# Patient Record
Sex: Female | Born: 1960 | Race: White | Hispanic: No | Marital: Married | Smoking: Never smoker
Health system: Southern US, Community
[De-identification: ages and names within clinical notes are randomized; demographics above are authoritative.]

## PROBLEM LIST (undated history)

## (undated) DIAGNOSIS — E079 Disorder of thyroid, unspecified: Secondary | ICD-10-CM

## (undated) DIAGNOSIS — I77810 Thoracic aortic ectasia: Secondary | ICD-10-CM

## (undated) DIAGNOSIS — M6281 Muscle weakness (generalized): Secondary | ICD-10-CM

## (undated) DIAGNOSIS — K219 Gastro-esophageal reflux disease without esophagitis: Secondary | ICD-10-CM

## (undated) DIAGNOSIS — J45909 Unspecified asthma, uncomplicated: Secondary | ICD-10-CM

## (undated) DIAGNOSIS — M79606 Pain in leg, unspecified: Secondary | ICD-10-CM

## (undated) DIAGNOSIS — G479 Sleep disorder, unspecified: Secondary | ICD-10-CM

## (undated) DIAGNOSIS — I2510 Atherosclerotic heart disease of native coronary artery without angina pectoris: Secondary | ICD-10-CM

## (undated) DIAGNOSIS — K222 Esophageal obstruction: Secondary | ICD-10-CM

## (undated) DIAGNOSIS — E785 Hyperlipidemia, unspecified: Secondary | ICD-10-CM

## (undated) DIAGNOSIS — I700 Atherosclerosis of aorta: Secondary | ICD-10-CM

## (undated) HISTORY — DX: Gastro-esophageal reflux disease without esophagitis: K21.9

## (undated) HISTORY — DX: Hyperlipidemia, unspecified: E78.5

## (undated) HISTORY — PX: TONSILLECTOMY: SUR1361

## (undated) HISTORY — DX: Muscle weakness (generalized): M62.81

## (undated) HISTORY — PX: UPPER GI ENDOSCOPY: SHX6162

## (undated) HISTORY — DX: Thoracic aortic ectasia: I77.810

## (undated) HISTORY — PX: DILATION AND CURETTAGE OF UTERUS: SHX78

## (undated) HISTORY — DX: Esophageal obstruction: K22.2

## (undated) HISTORY — DX: Atherosclerosis of aorta: I70.0

## (undated) HISTORY — DX: Unspecified asthma, uncomplicated: J45.909

## (undated) HISTORY — DX: Atherosclerotic heart disease of native coronary artery without angina pectoris: I25.10

## (undated) HISTORY — DX: Sleep disorder, unspecified: G47.9

## (undated) HISTORY — DX: Disorder of thyroid, unspecified: E07.9

## (undated) HISTORY — DX: Pain in leg, unspecified: M79.606

---

## 1997-01-18 HISTORY — PX: DILATION AND CURETTAGE OF UTERUS: SHX78

## 1997-01-18 HISTORY — PX: DIAGNOSTIC LAPAROSCOPY: SUR761

## 2005-05-04 ENCOUNTER — Ambulatory Visit: Payer: Self-pay

## 2005-11-12 HISTORY — PX: URETHRAL DILATION: SUR417

## 2009-04-19 ENCOUNTER — Ambulatory Visit: Payer: Self-pay | Admitting: Internal Medicine

## 2009-12-28 ENCOUNTER — Ambulatory Visit: Payer: Self-pay | Admitting: Unknown Physician Specialty

## 2010-05-02 IMAGING — RF DG UGI W/O KUB
1 series · 14 of 24 positions shown · non-contrast
Comparison: No comparison

REASON FOR EXAM: acid reflux
COMMENTS:

PROCEDURE:     FL  - FL UPPER GI  - April 19, 2009  [DATE]
RESULT:     Indication: Acid reflux

[Series 1: run · 25 acquisitions, 14 frames shown]
[im 1/25]
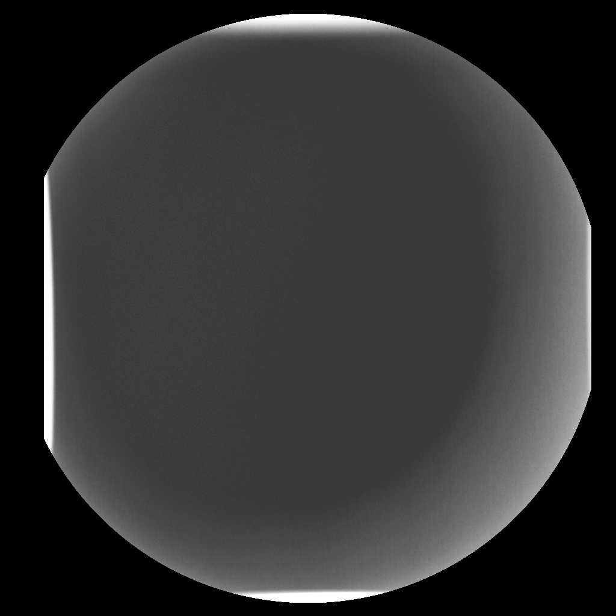
[im 2/25]
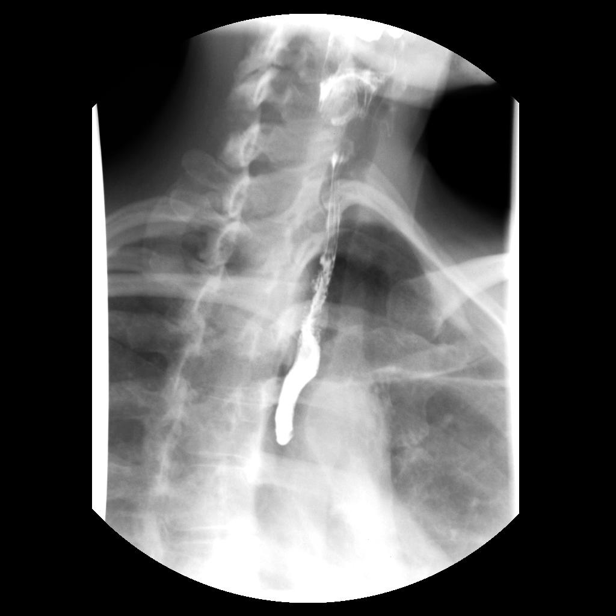
[im 3/25]
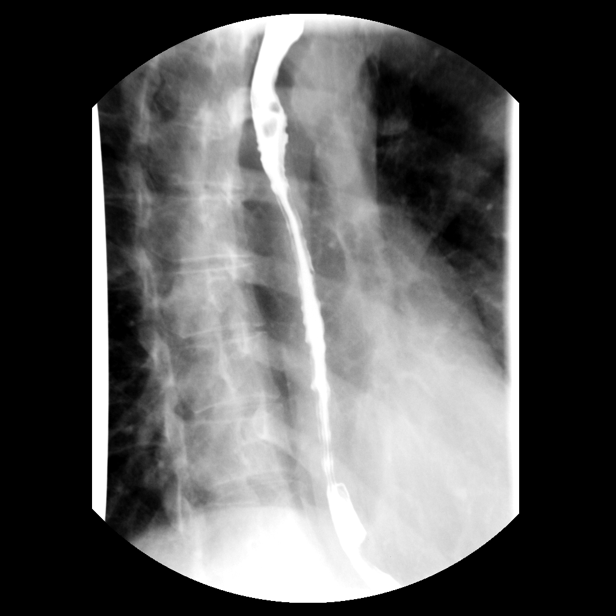
[im 4/25]
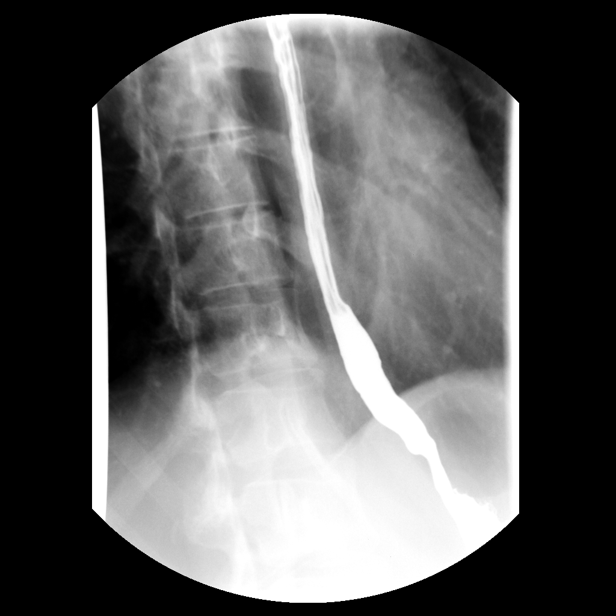
[im 5/25]
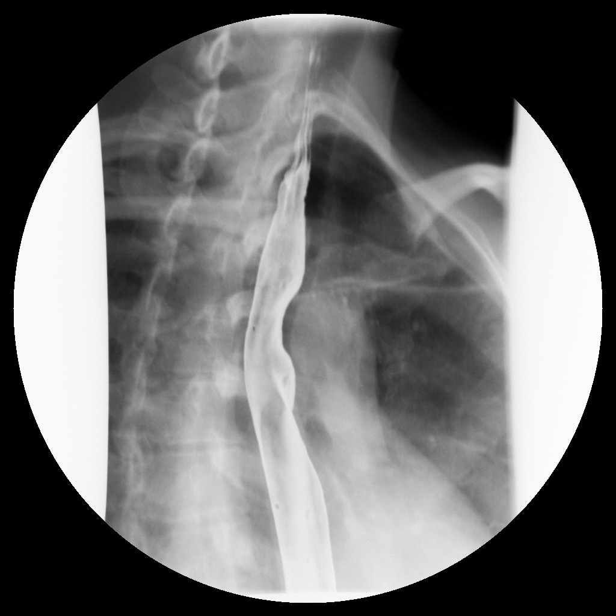
[im 5/25]
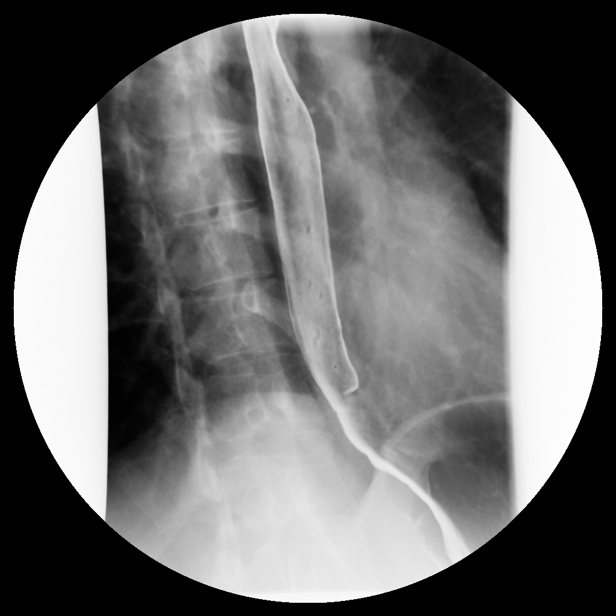
[im 6/25]
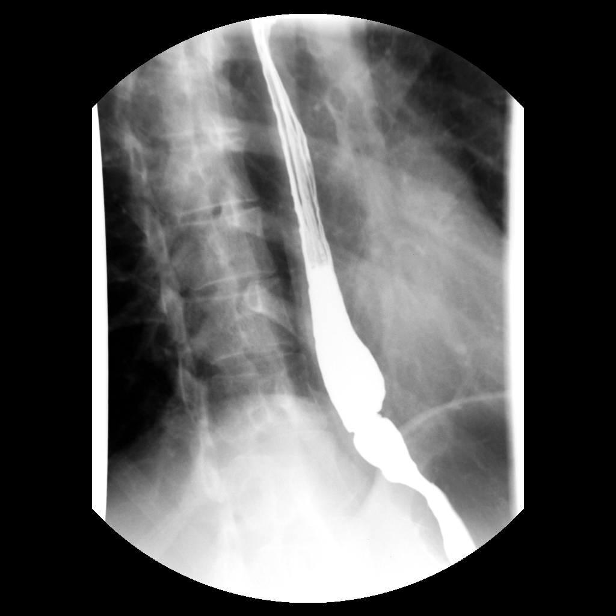
[im 7/25]
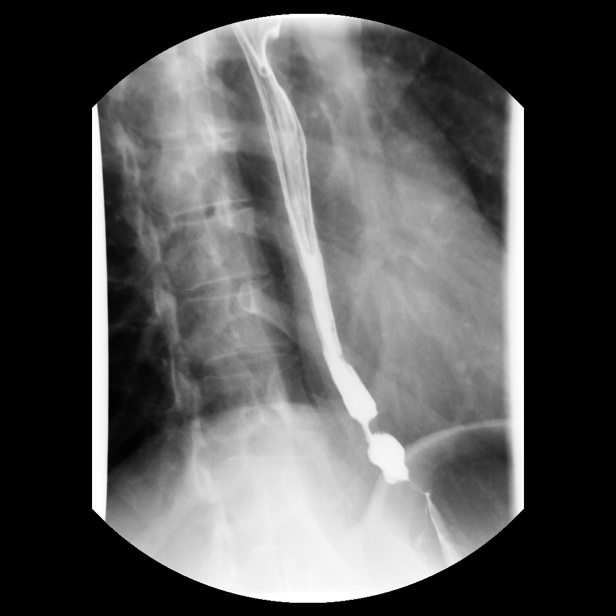
[im 10/25]
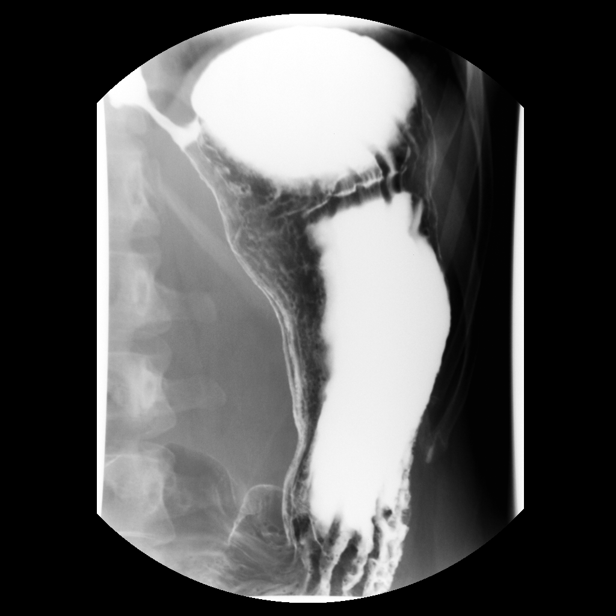
[im 13/25]
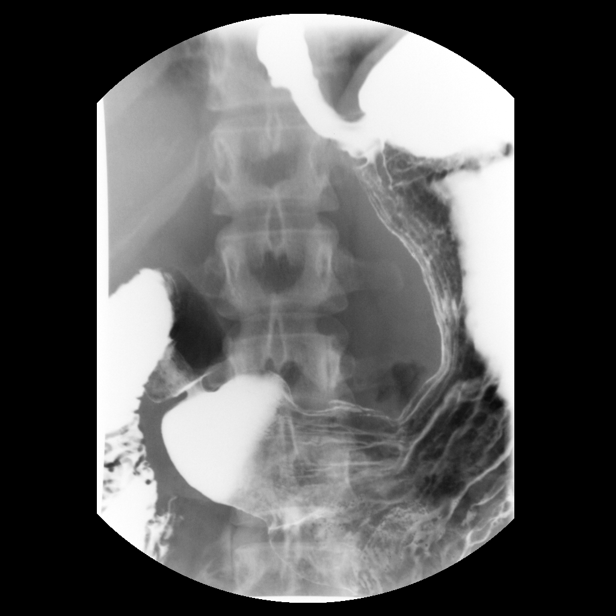
[im 17/25]
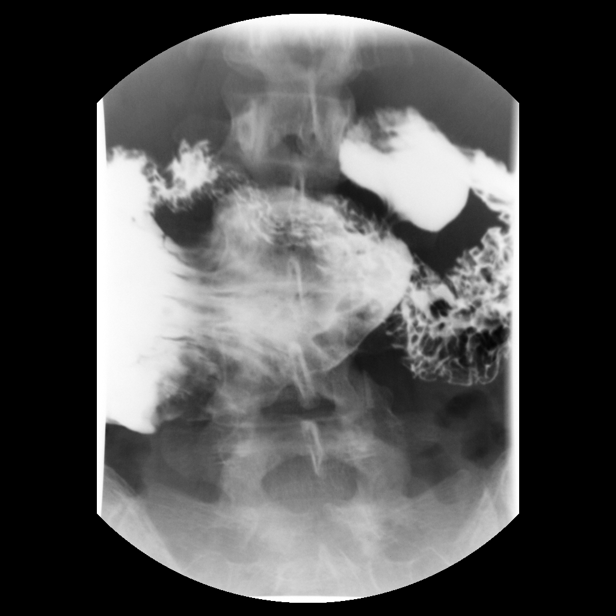
[im 18/25]
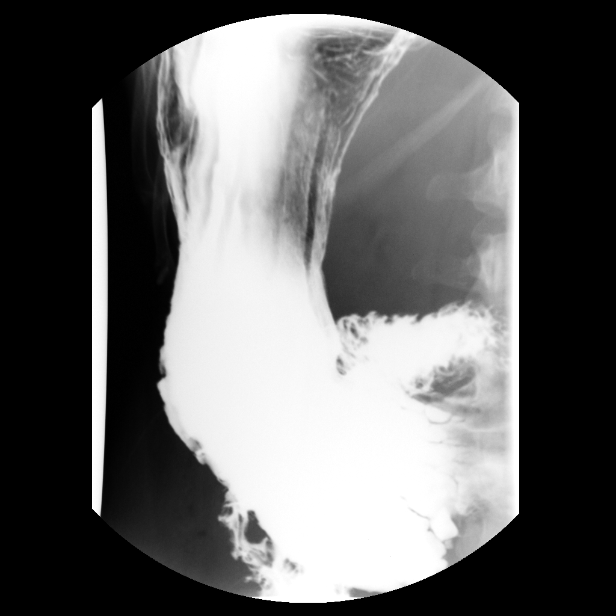
[im 21/25]
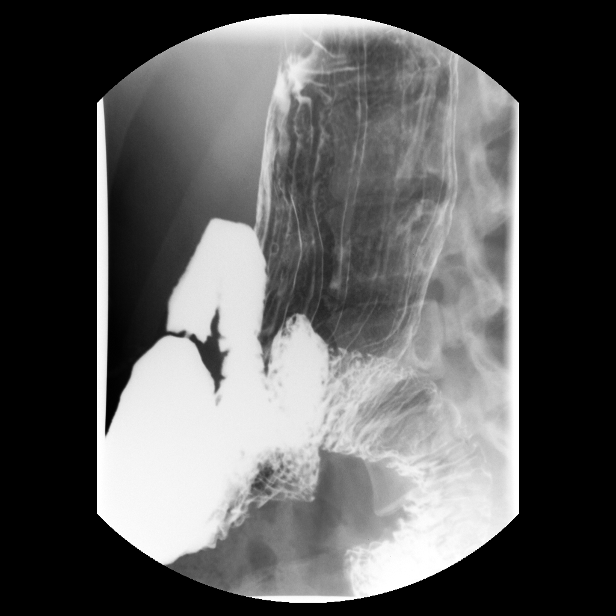
[im 25/25]
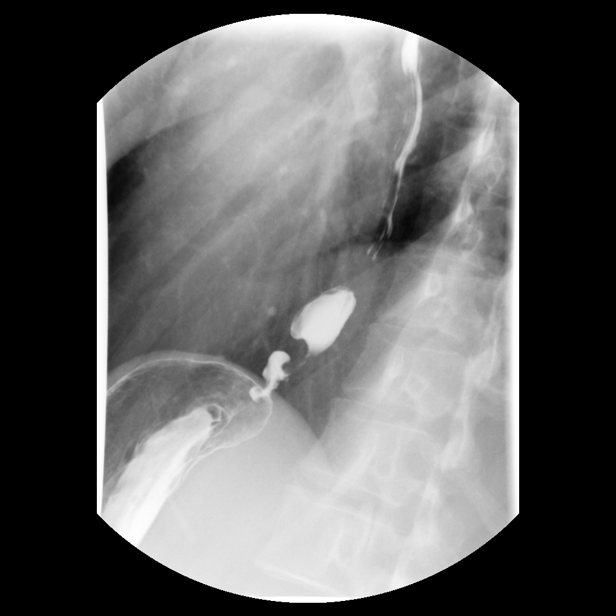

[14 of 24 positions shown; findings below may reference images not displayed]

FINDINGS: Biphasic examination of the esophagus to the distal duodenum was performed
without complication. Total fluoroscopy time was 2.1 minutes.

Examination of the esophagus demonstrated normal esophageal motility. Normal
esophageal morphology without evidence of esophagitis or ulceration. There
is a small distal esophageal web just proximal to the gastroesophageal
junction without obstruction of flow. No evidence of hiatal hernia. There is
no spontaneous or inducible gastroesophageal reflux.

Examination of the stomach demonstrated normal rugal folds and areae
gastricae. The gastric mucosa appeared unremarkable without evidence of
ulceration, scarring, or mass lesion. Gastric motility and emptying was
normal. Fluoroscopic examination of the duodenum demonstrates normal
motility and morphology without evidence of ulceration or mass lesion.

The patient did not feel that she could swallow the barium tablet for
further assessment of the distal esophageal web.
IMPRESSION: 1. Mild distal esophageal web just proximal to the gastroesophageal
junction. The patient did not feel that she could swallow the barium tablet
for further assessment of the distal esophageal web.

## 2012-10-31 LAB — HEPATIC FUNCTION PANEL
ALT: 12 U/L (ref 7–35)
AST: 16 U/L (ref 13–35)
Alkaline Phosphatase: 64 U/L (ref 25–125)
Bilirubin, Total: 0.3 mg/dL

## 2012-10-31 LAB — CBC AND DIFFERENTIAL
HEMATOCRIT: 38 % (ref 36–46)
Hemoglobin: 12.6 g/dL (ref 12.0–16.0)
NEUTROS ABS: 3 /uL
Platelets: 343 10*3/uL (ref 150–399)
WBC: 5.3 10^3/mL

## 2012-10-31 LAB — LIPID PANEL
CHOLESTEROL: 197 mg/dL (ref 0–200)
HDL: 98 mg/dL — AB (ref 35–70)
LDL Cholesterol: 91 mg/dL
LDL/HDL RATIO: 0.9
Triglycerides: 40 mg/dL (ref 40–160)

## 2012-10-31 LAB — BASIC METABOLIC PANEL
BUN: 9 mg/dL (ref 4–21)
Creatinine: 0.7 mg/dL (ref 0.5–1.1)
Glucose: 89 mg/dL
Potassium: 4.1 mmol/L (ref 3.4–5.3)
SODIUM: 138 mmol/L (ref 137–147)

## 2013-01-05 ENCOUNTER — Inpatient Hospital Stay: Payer: Self-pay | Admitting: Internal Medicine

## 2013-01-05 LAB — BASIC METABOLIC PANEL
BUN: 11 mg/dL (ref 7–18)
Calcium, Total: 9.1 mg/dL (ref 8.5–10.1)
Chloride: 106 mmol/L (ref 98–107)
Co2: 29 mmol/L (ref 21–32)
Creatinine: 0.68 mg/dL (ref 0.60–1.30)
Glucose: 111 mg/dL — ABNORMAL HIGH (ref 65–99)
Osmolality: 278 (ref 275–301)
Sodium: 139 mmol/L (ref 136–145)

## 2013-01-05 LAB — CK TOTAL AND CKMB (NOT AT ARMC): CK, Total: 78 U/L (ref 21–215)

## 2013-01-05 LAB — CBC
HCT: 39.8 % (ref 35.0–47.0)
HGB: 12.9 g/dL (ref 12.0–16.0)
MCH: 26.1 pg (ref 26.0–34.0)
RBC: 4.93 10*6/uL (ref 3.80–5.20)
RDW: 14.1 % (ref 11.5–14.5)
WBC: 6.1 10*3/uL (ref 3.6–11.0)

## 2013-01-05 LAB — TROPONIN I: Troponin-I: <0.02 ng/mL

## 2014-01-18 IMAGING — CR DG CHEST 2V
1 series · 2 of 2 positions shown · non-contrast
Comparison: none

REASON FOR EXAM: SOB
COMMENTS:

PROCEDURE:     DXR - DXR CHEST PA (OR AP) AND LATERAL  - January 05, 2013  [DATE]
RESULT:     Lungs clear. Heart size normal.

[Series 1: w chest pa · 0.14mm/px · 2 of 2 slices shown]
[im 1/2]
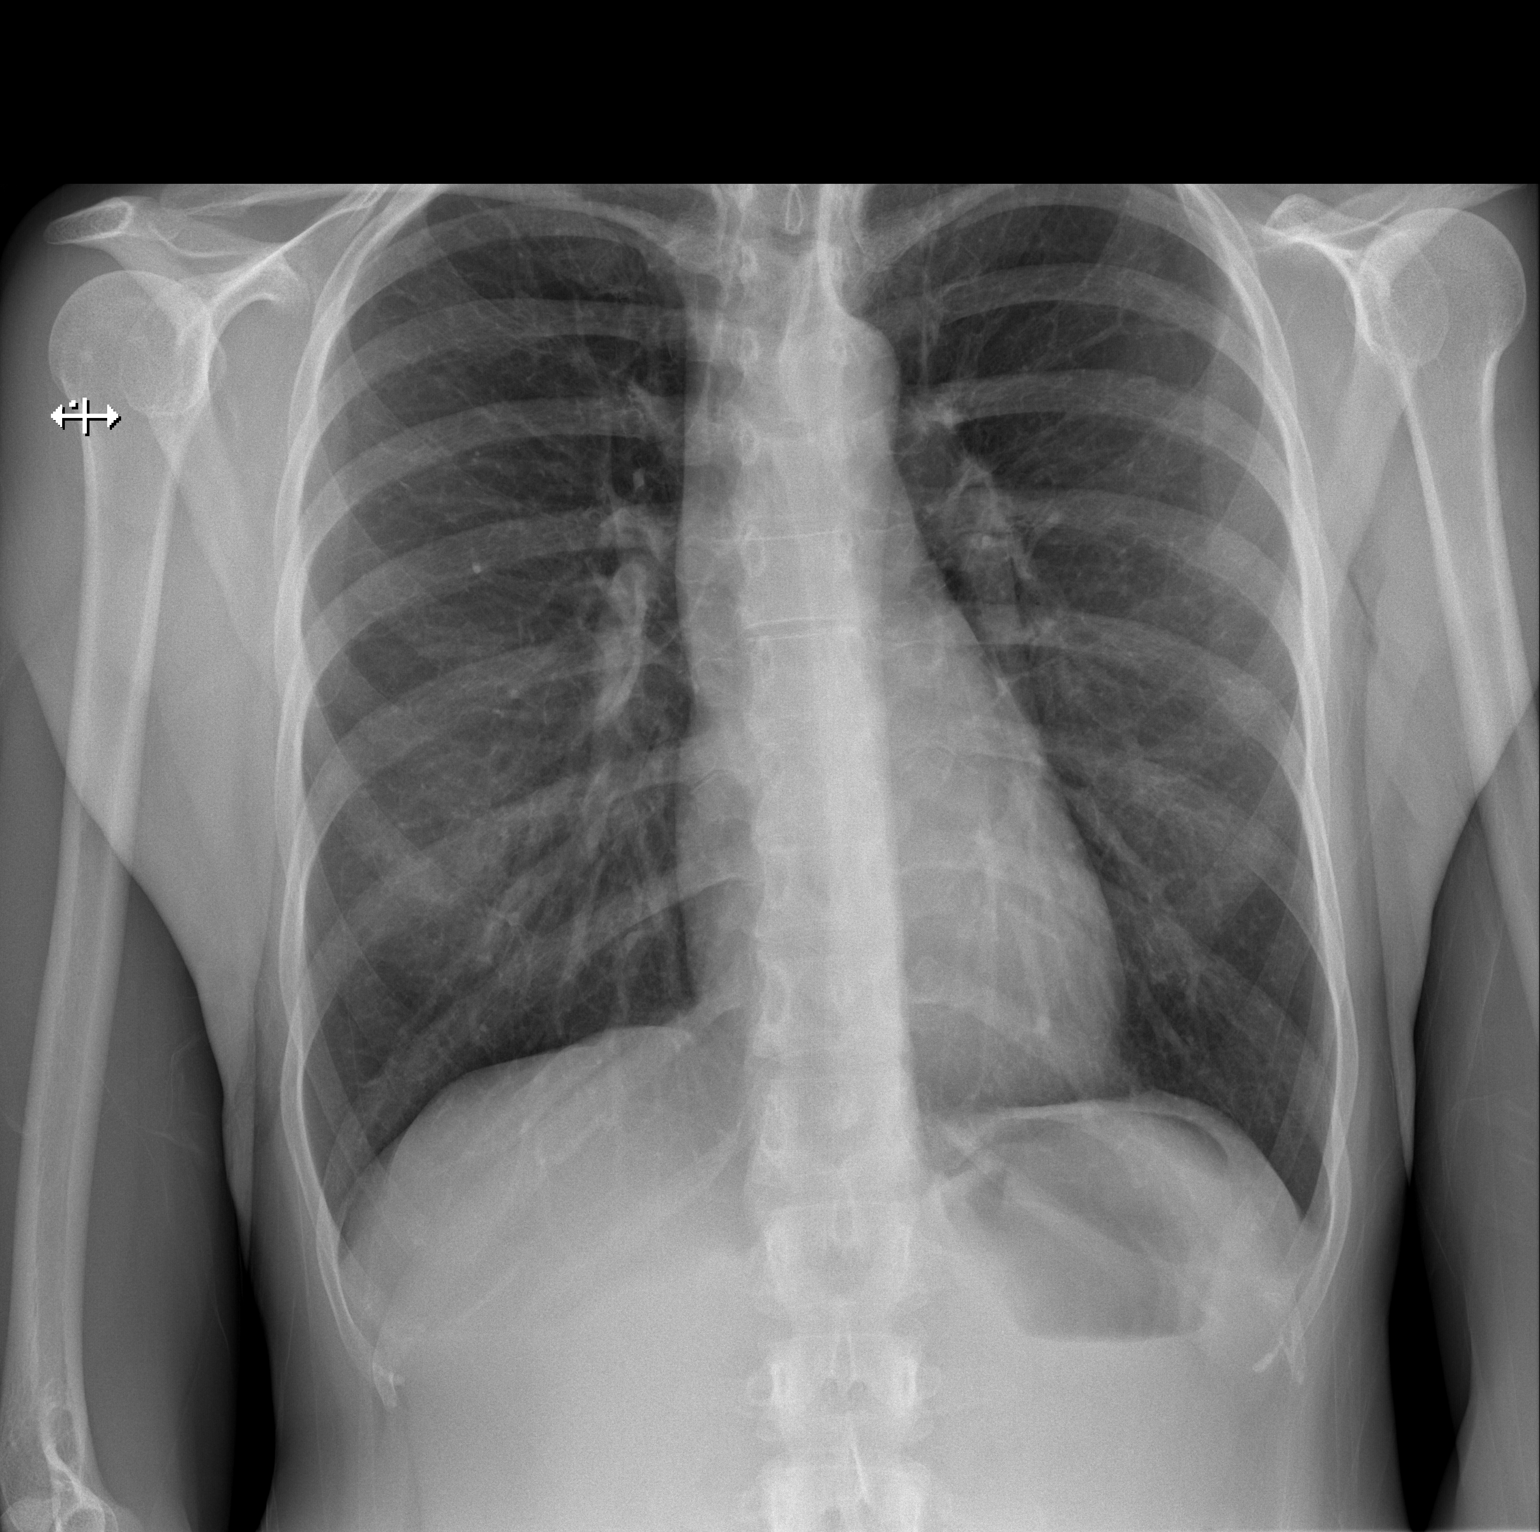
[im 2/2]
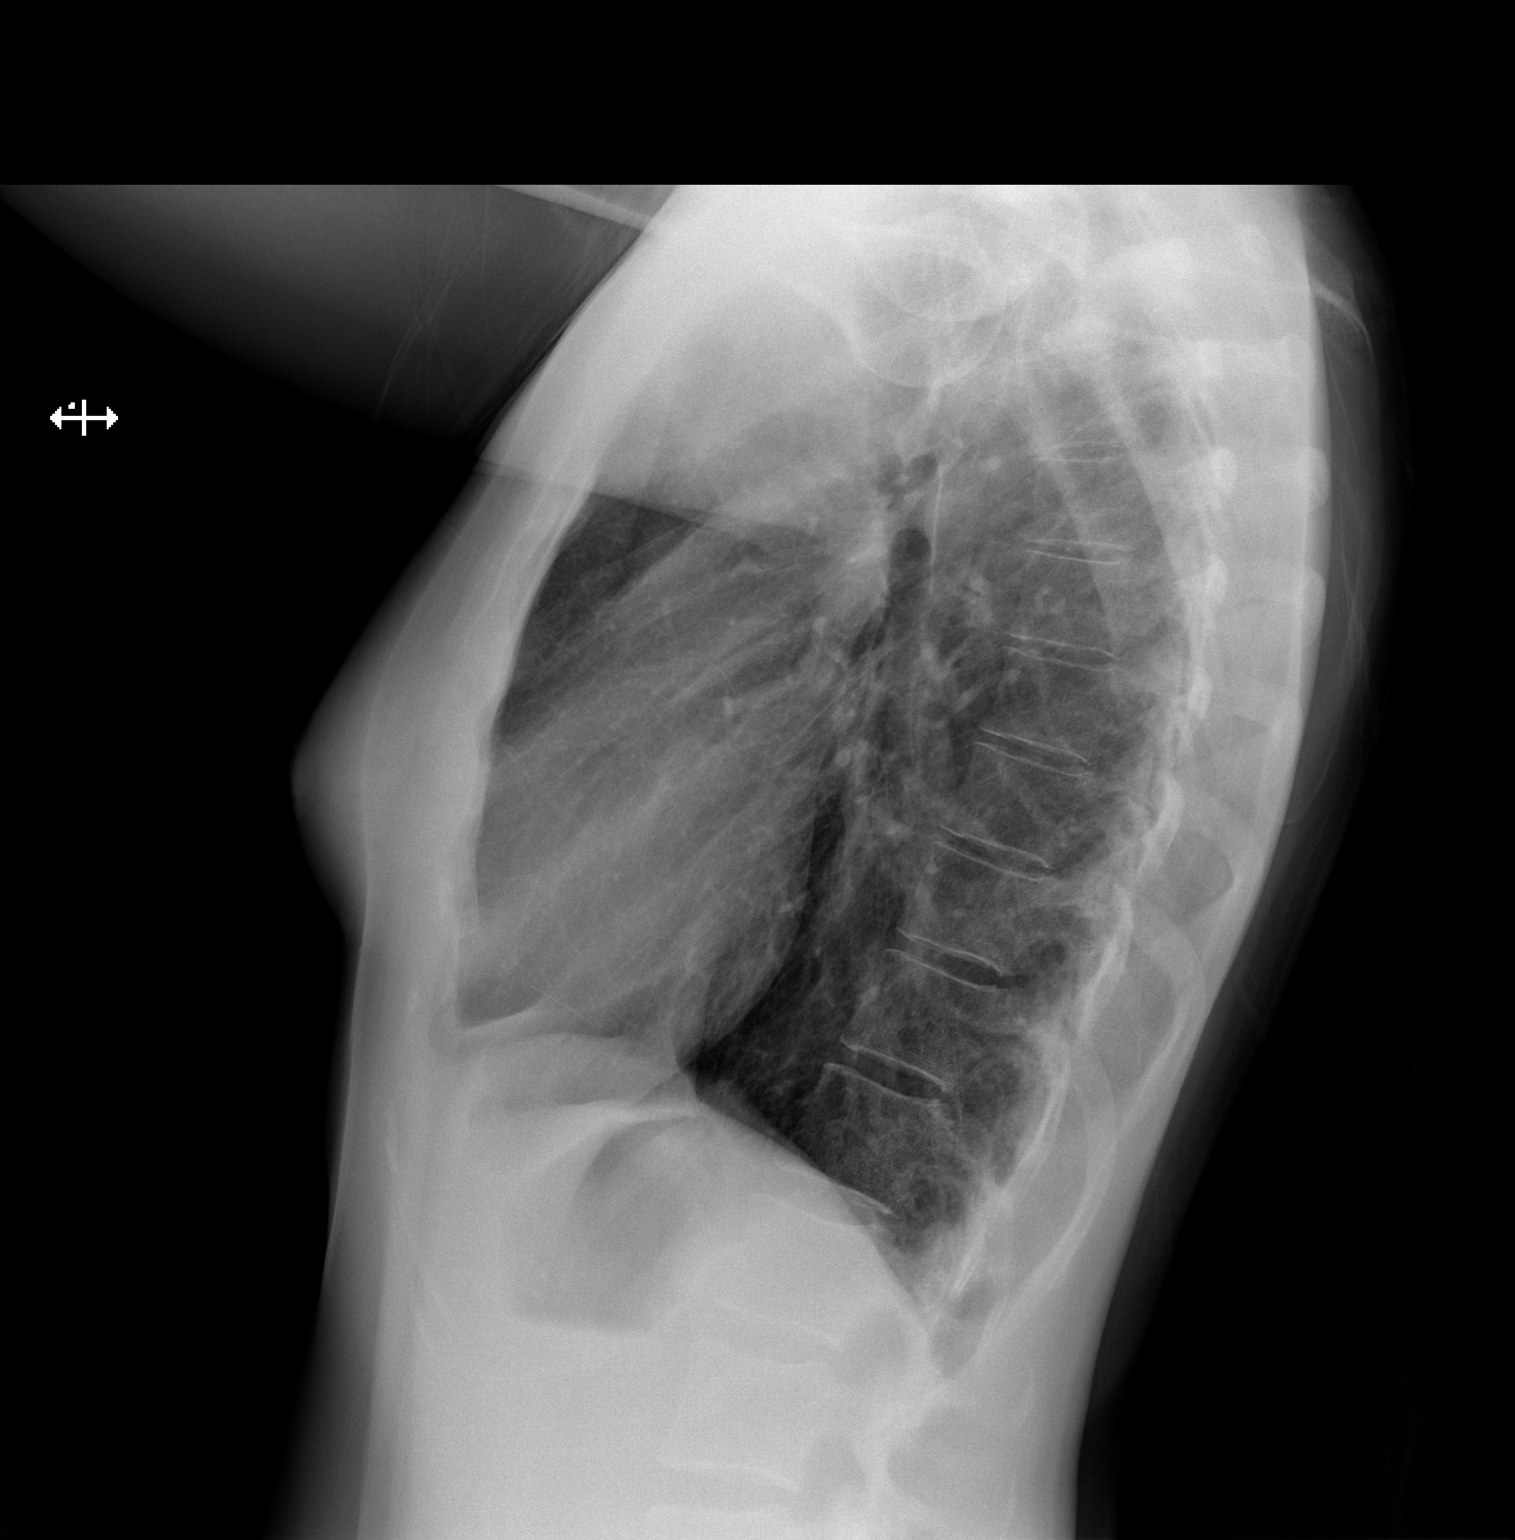

[2 of 2 positions shown; findings below may reference images not displayed]

IMPRESSION: No acute abnormality.

## 2014-01-18 IMAGING — CT CT CHEST W/ CM
1 series · 16 of 33 positions shown, 20 images · non-contrast
Comparison: none

REASON FOR EXAM: dyspnea on excertion
COMMENTS:

PROCEDURE:     CT  - CT CHEST (FOR PE) W  - January 05, 2013  [DATE]
RESULT:     History: Chest tightness.
Comparison Study: Chest x-ray of 01/05/2013.

[Series 4: soft tissue · axial · 0.62mm/px · z∈[-665,-335]mm · 16 of 120 slices shown, 20 images]
[im 5/120  mediastinal]
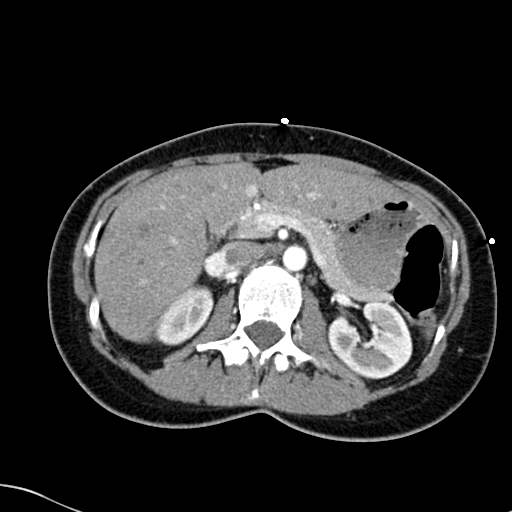
[im 5/120  lung]
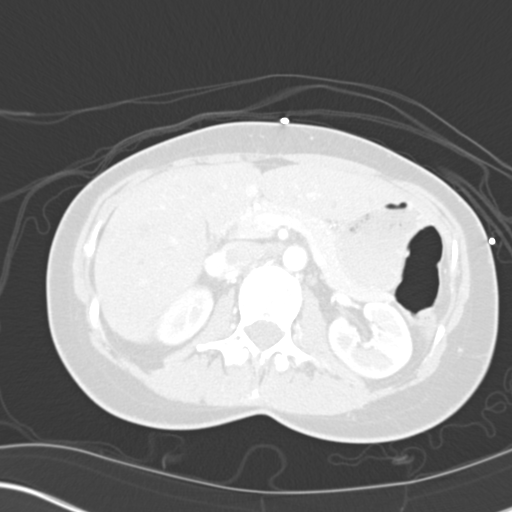
[im 14/120  lung]
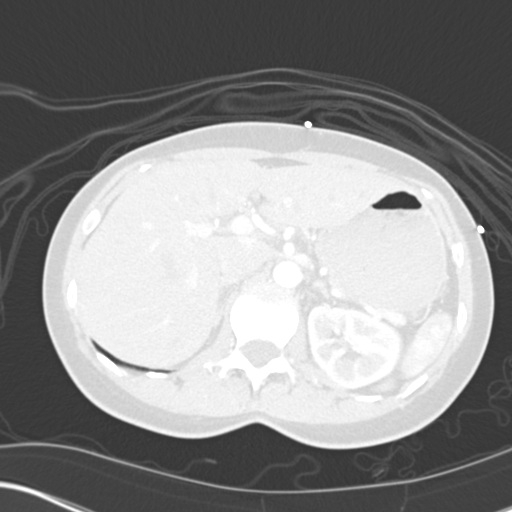
[im 23/120  lung]
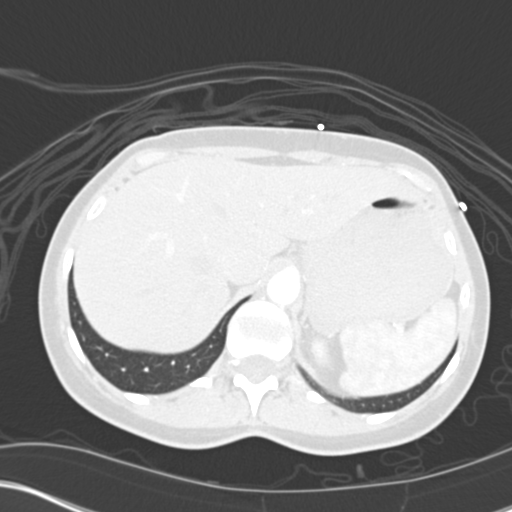
[im 27/120  lung]
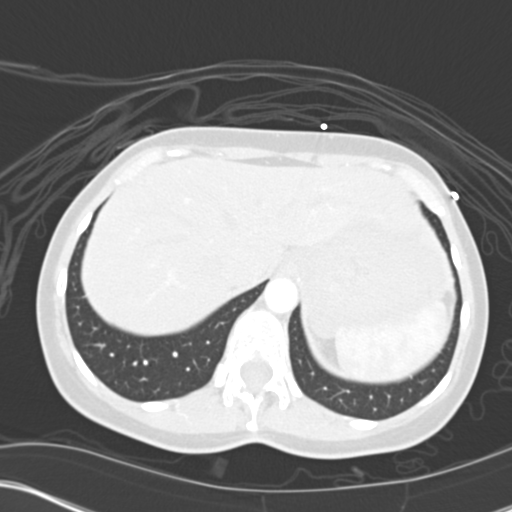
[im 36/120  mediastinal]
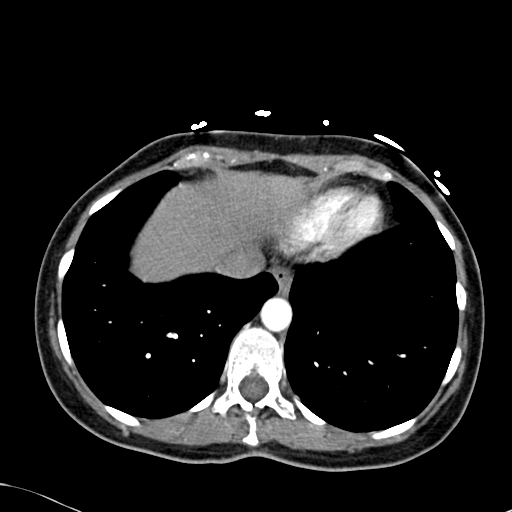
[im 36/120  lung]
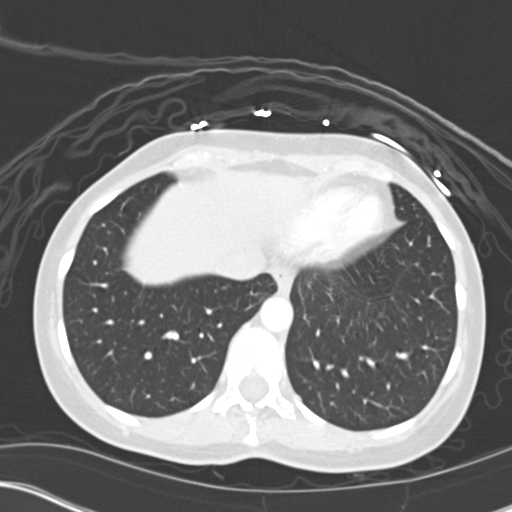
[im 45/120  lung]
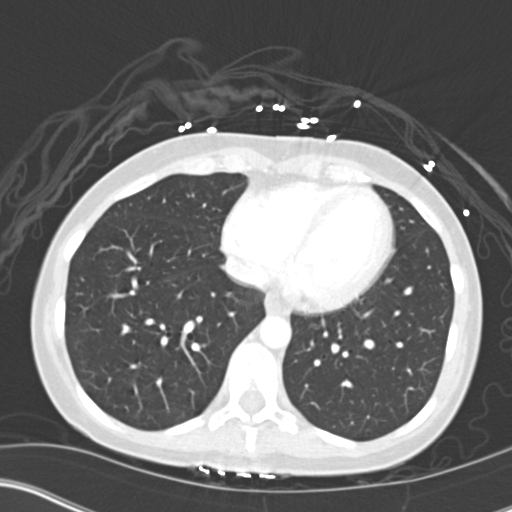
[im 49/120  lung]
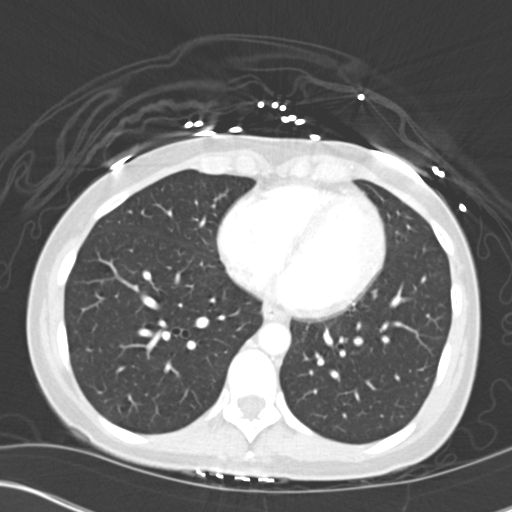
[im 58/120  lung]
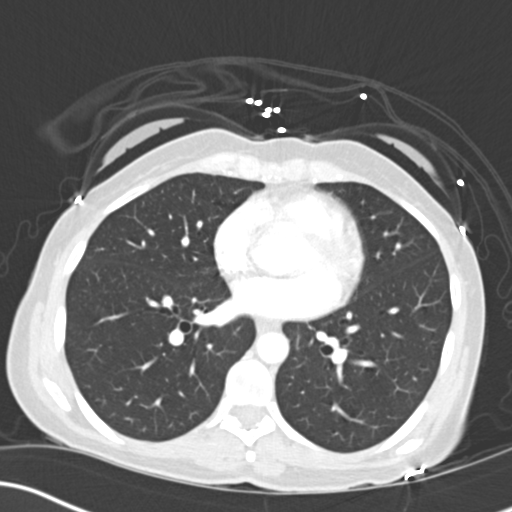
[im 63/120  mediastinal]
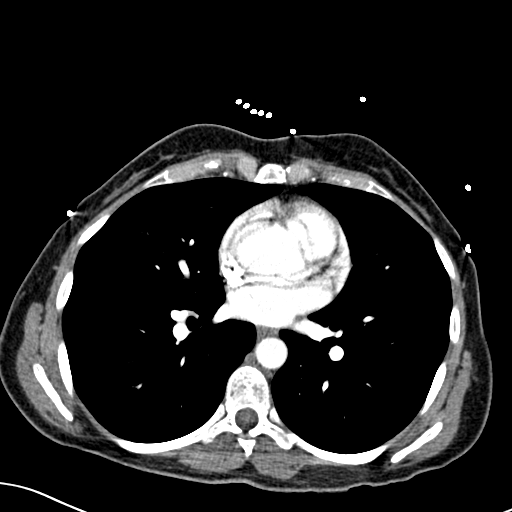
[im 63/120  lung]
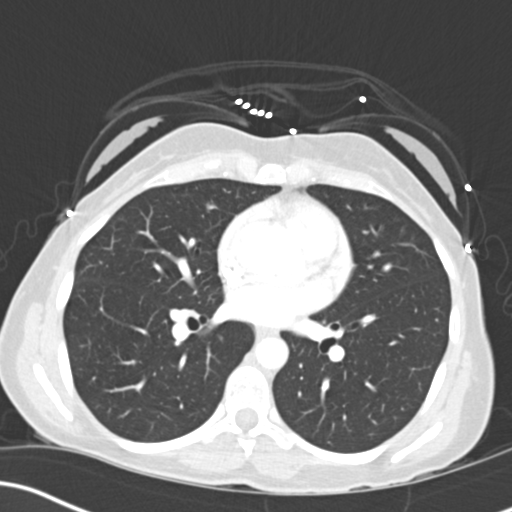
[im 71/120  lung]
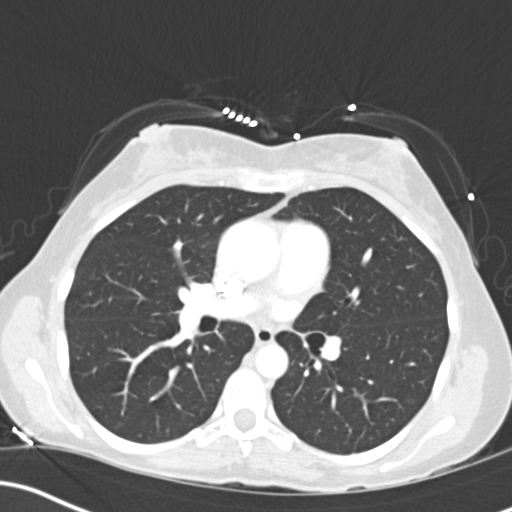
[im 75/120  lung]
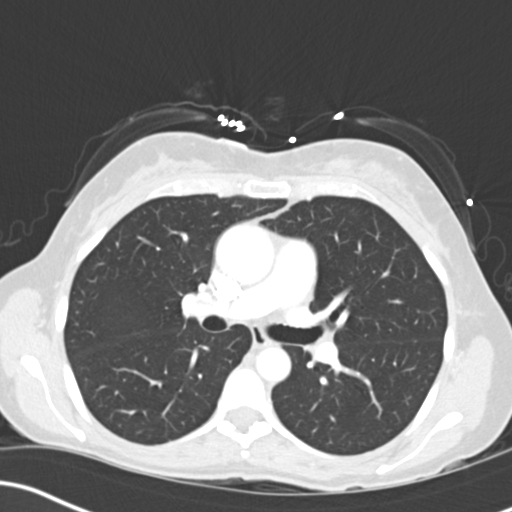
[im 84/120  lung]
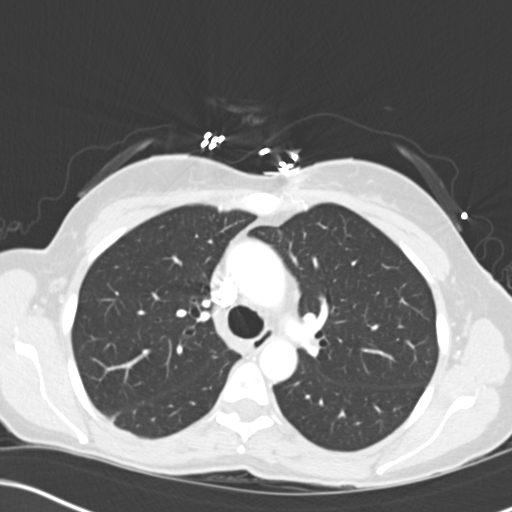
[im 93/120  mediastinal]
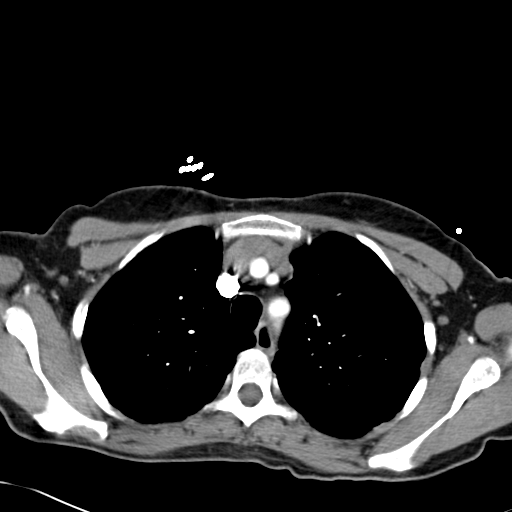
[im 93/120  lung]
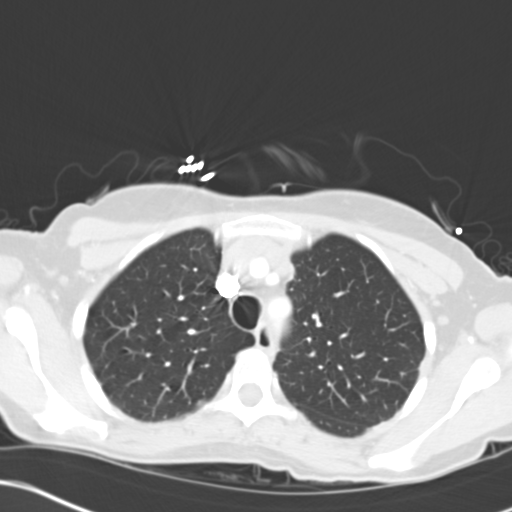
[im 97/120  lung]
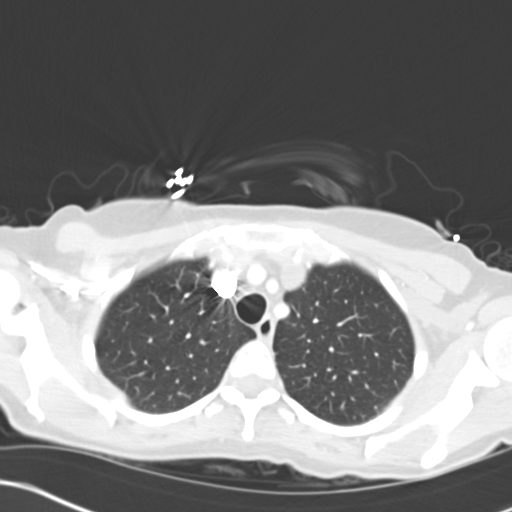
[im 106/120  lung]
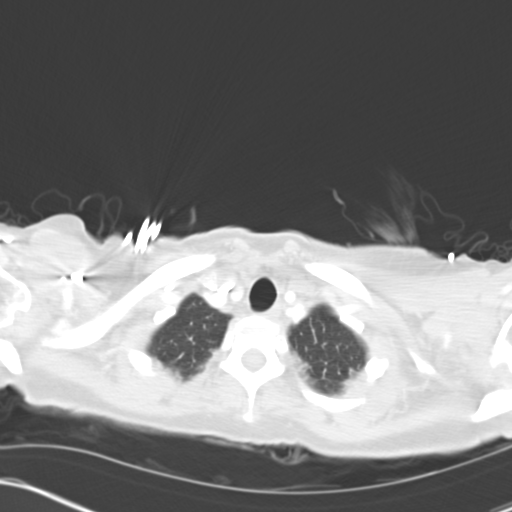
[im 115/120  lung]
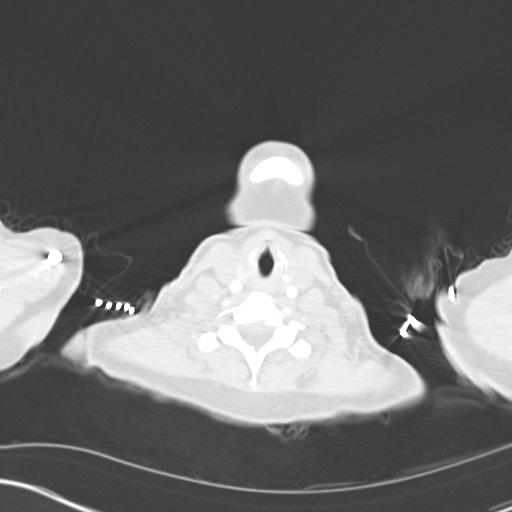

[16 of 33 positions shown; findings below may reference images not displayed]

FINDINGS: Standard CT obtained under cc of [id number]. Aortic dissection of
the ascending aorta  cannot be excluded. Although motion artifact could
present in this fashion again a dissection cannot be excluded. Involvement
of the coronary arteries cannot be excluded. Maximum diameter of the
ascending aorta  4.5 cm .Descending thoracic aorta and upper abdominal aorta
normal. Great vessels are normal. Pulmonary arteries are normal. Heart size
normal. Adrenals normal. Kidneys unremarkable. Large airways patent. Lungs
are clear.
IMPRESSION: Possible aortic dissection involving the ascending thoracic
aorta. Cardio thoracic surgery consultation suggested. Report phoned to
patient's physician at time of procedure.

## 2015-03-04 NOTE — H&P (Signed)
Subjective/Chief Complaint dyspnea on excertion, chest tightness 1 month, worsening since yesterday.   History of Present Illness 54 yo female, asthma, hypothyroidism, esophageal stricture, GERD  dyspnea on excertion, chest tightness 1 month, worsening since yesterday. Pt has had dyspnea on and off for 2 years. But has been worsening, recently, with dizziness, chest tightness, weakness. Pt HR up to 140 while walking in ED. denies any cough, sputum, orthopnea, but has leg edema sometimes. Pt denies any long distance travel recently.   Past History asthma, hypothyroidism, esophageal stricture, GERD   Primary Physician Dr. Rosanna Randy   Past Med/Surgical Hx:  GERD:   Esophageal Strictures with dilitations:   Hypothyroidism:   Asthma:   ALLERGIES:  No Known Allergies:   HOME MEDICATIONS: Medication Instructions Status  Armour Thyroid 30 mg oral tablet milligram(s) orally every other day. Alternates between 30mg  and 45mg . Active  Nexium 40 mg oral delayed release capsule 1 cap(s) orally once a day Active  amitriptyline 100 mg oral tablet 1 tab(s) orally once a day (at bedtime) Active  progesterone 1.7% topical cream Apply topically to affected area once a day Active  testosterone 2% transdermal cream 1 application transdermal once a day Active  Symbicort 80 mcg-4.5 mcg/inh inhalation aerosol 2 puff(s) inhaled once a day, As Needed Active  lorazepam 0.5 mg oral tablet 1 tab(s) orally once a day (at bedtime), As Needed- for Anxiety, Nervousness  Active   Family and Social History:  Family History Hypertension  Diabetes Mellitus   Social History negative tobacco, negative ETOH, negative Illicit drugs   Review of Systems:  Fever/Chills No   Cough No   Sputum No   Abdominal Pain No   Diarrhea No   Constipation No   Nausea/Vomiting No   SOB/DOE Yes   Chest Pain No  tightness   Telemetry Reviewed NSR   Dysuria No   Tolerating PT No   Tolerating Diet No    Medications/Allergies Reviewed Medications/Allergies reviewed   Physical Exam:  GEN no acute distress, thin   HEENT pink conjunctivae, PERRL, moist oral mucosa   NECK supple  No masses  thyroid not tender   RESP normal resp effort  clear BS  no use of accessory muscles   CARD regular rate  no murmur  no thrills  no carotid bruits  No LE edema  no JVD   ABD denies tenderness  no liver/spleen enlargement  no hernia  soft  normal BS  no Adominal Mass   LYMPH negative neck   EXTR negative cyanosis/clubbing, negative edema   SKIN normal to palpation, No rashes, No ulcers, skin turgor good   NEURO cranial nerves intact, follows commands, motor/sensory function intact   PSYCH alert, A+O to time, place, person, good insight   Lab Results:  Routine Chem:  24-Feb-14 14:31   Glucose, Serum  111  BUN 11  Creatinine (comp) 0.68  Sodium, Serum 139  Potassium, Serum  3.4  Chloride, Serum 106  CO2, Serum 29  Calcium (Total), Serum 9.1  Anion Gap  4  Osmolality (calc) 278  eGFR (African American) >60  eGFR (Non-African American) >60 (eGFR values <48mL/min/1.73 m2 may be an indication of chronic kidney disease (CKD). Calculated eGFR is useful in patients with stable renal function. The eGFR calculation will not be reliable in acutely ill patients when serum creatinine is changing rapidly. It is not useful in  patients on dialysis. The eGFR calculation may not be applicable to patients at the low and high extremes  of body sizes, pregnant women, and vegetarians.)  Cardiac:  24-Feb-14 14:31   Troponin I < 0.02 (0.00-0.05 0.05 ng/mL or less: NEGATIVE  Repeat testing in 3-6 hrs  if clinically indicated. >0.05 ng/mL: POTENTIAL  MYOCARDIAL INJURY. Repeat  testing in 3-6 hrs if  clinically indicated. NOTE: An increase or decrease  of 30% or more on serial  testing suggests a  clinically important change)  CK, Total 78  CPK-MB, Serum  < 0.5 (Result(s) reported on 05 Jan 2013 at  02:55PM.)  Routine Coag:  24-Feb-14 19:49   D-Dimer, Quantitative  1.06 (If the D-dimer test is being used to assist in the exclusion of DVT and/or PE, note the following:  In various studies concerning the D-dimer methodology (STA Liatest) in use by this laboratory, it has been reported that with a cut-off value of 0.50 ug/mL FEU, the  negative predictive value regarding the exclusion of thrombosis is within the 95-100% range.  In patients with high pre-test probability of DVT/PE the results of the D-dimer test should be correlated with other diagnostic and clinical assessment modalities." Reference: [employer]., 2005.)  Routine Hem:  24-Feb-14 14:31   WBC (CBC) 6.1  RBC (CBC) 4.93  Hemoglobin (CBC) 12.9  Hematocrit (CBC) 39.8  Platelet Count (CBC) 310 (Result(s) reported on 05 Jan 2013 at 02:51PM.)  MCV 81  MCH 26.1  MCHC 32.4  RDW 14.1    Assessment/Admission Diagnosis dyspnea on excertion, Tachycardia elevated d-dimer,  r/o PE asthma Hypothyroidism GERD   Plan tele, O2,  CT angio, if PE, will give lovenox 78m/kg SQ q12h. f/u BNP, echo. cont symbicort, xopenex prn. lipid panel protonix  FULL CODE   Electronic Signatures: Demetrios Loll (MD)  (Signed 24-Feb-14 21:02)  Authored: CHIEF COMPLAINT and HISTORY, PAST MEDICAL/SURGIAL HISTORY, ALLERGIES, HOME MEDICATIONS, FAMILY AND SOCIAL HISTORY, REVIEW OF SYSTEMS, PHYSICAL EXAM, LABS, ASSESSMENT AND PLAN   Last Updated: 24-Feb-14 21:02 by Demetrios Loll (MD)

## 2015-03-04 NOTE — H&P (Signed)
PATIENT NAME:  Lisa KirschnerWARNER, Tuwana W MR#:  [id number] DATE OF BIRTH:  1961-10-31  DATE OF ADMISSION:  01/05/2013  PRIMARY CARE PHYSICIAN:  Dr. Sullivan LoneGilbert.  REFERRING PHYSICIAN: Dr. Shaune PollackLord.   CHIEF COMPLAINT: Dyspnea on exertion, with chest tightness for one month, worsening since yesterday.   HPI: A 54 year old Caucasian female with a history of asthma, hypothyroidism, esophageal stricture, GERD, presented to the ED with dyspnea on exertion and chest heaviness for one month, worsening since yesterday. The patient is alert, awake, oriented, in no acute distress. The patient said she has had dyspnea on and off for the past two years, but that has been worsening recently for the past one month. She also complains of dizziness, chest tightness, and generalized weakness. In addition, she feels some back pain sometimes, but she denies any fever or chills. No cough, sputum, or orthopnea, or nocturnal dyspnea, but has some leg edema sometimes. She denies any long distance travel recently.   PAST MEDICAL HISTORY: Asthma, hypothyroidism, esophageal stricture, and GERD.    SOCIAL HISTORY: No smoking, alcohol drinking. No illicit drugs.    SURGICAL HISTORY: No.   FAMILY HISTORY: Mother had a stroke and hypertension. Father has diabetes.   ALLERGIES: No.  MEDICATIONS:  1. Armour Thyroid 30 mg p.o. every other day, alternate between 30 mg and  45 mg.  2. Nexium 40 mg p.o. daily.  3. Amitriptyline 100 mg p.o. daily at bedtime.  4. Progesterone 1.7% topical cream, apply topically to affected area once a day.  5. Testosterone 2% transdermal cream, one application transdermal once a day.  6. Symbicort 18 mcg/4.5 mcg inhalation, aerosol, two puffs once a day, p.r.n.  7. Ativan 0.5 mg p.o. one tablet once a day at bedtime p.r.n., for anxiety and nervousness.   REVIEW OF SYSTEMS:   CONSTITUTIONAL: The patient denies any fever or chills. No headache, or dizziness, but has dizziness and generalized weakness.  EYES:  No double vision or blurred vision.  ENT: No postnasal drip, slurred speech, or dysphagia.  CARDIOVASCULAR: Positive for chest tightness, but no orthopnea or nocturnal dyspnea. No leg edema.  PULMONARY: Positive for shortness of breath on exertion, but no cough, sputum, or hemoptysis or wheezing.  GASTROINTESTINAL: No abdominal pain, nausea, vomiting, or diarrhea. No melena. No bloody stools.  GENITOURINARY: No dysuria, hematuria, or incontinence.  SKIN: No rash or jaundice.  NEUROLOGY: No syncope, loss of consciousness, or seizure.  HEMATOLOGY: No easy bruising or bleeding.  ENDOCRINOLOGY: No polyuria, polydipsia, heat or cold intolerance.  PSYCHIATRIC: No anxiety or depression.   PHYSICAL EXAMINATION: VITAL SIGNS: Temperature 97.7, blood pressure 136/79, respirations 18, oxygen saturation 97% on room air.  GENERAL: The patient is alert, awake, oriented, in no acute distress.  HEENT: Pupils round, equal, reactive to light and accommodation. Moist oral mucosa. Clear oropharynx.  NECK: Supple. No JVD or carotid bruit. No lymphadenopathy. No thyromegaly.  CARDIOVASCULAR: S1, S2. Regular rate and rhythm. No murmurs, gallops.  PULMONARY: Bilateral air entry. No wheezing or rales. No use of accessory muscles to breathe.  ABDOMEN: Soft. No distention or tenderness. No organomegaly. Bowel sounds present.  EXTREMITIES: No edema, clubbing, or cyanosis. No calf tenderness. Strong bilateral pedal pulses.  SKIN: No rash or jaundice.  NEUROLOGY: AAO x 3. No focal deficits. Power 5/5. Sensation intact.    LABORATORY DATA:  D-dimer 1.06.   CT angio showed possible aortic dissection involving ascending thoracic aorta. Troponin level is less than 0.02. CK 78, CK-MB less than 0.5.   CBC  normal. Glucose 111, BUN 11, creatinine 0.68, potassium 3.4, chloride 106, bicarbonate  29, BNP 32.   EKG showed normal sinus rhythm at about 80s.   IMPRESSIONS: 1. Ascending aortic dissection.  2. History of  asthma, hypothyroidism and GERD.  3. Hypokalemia.   PLAN OF TREATMENT: Initially patient was admitted for observation due to dyspnea on exertion.   We checked a D-dimer. Since D-dimer is high we got a CAT scan of chest with contrast which showed aortic dissection, so the patient will be transferred for to [facility], Dr. Ellis Savage, CT surgeon. Accepted the patient. Patient will be transferred to [facility] hospital today.    ____________________________ Shaune Pollack, MD qc:dm D: 01/05/2013 23:18:00 ET T: 01/06/2013 07:26:29 ET JOB#: [id number]  cc: Shaune Pollack, MD, <Dictator> Shaune Pollack MD ELECTRONICALLY SIGNED 01/07/2013 16:30

## 2015-03-04 NOTE — Discharge Summary (Signed)
PATIENT NAME:  Lisa Robbins, Lisa Robbins MR#:  [id number] DATE OF BIRTH:  02/24/1961  DATE OF ADMISSION:  01/05/2013 DATE OF DISCHARGE:    TRANSFER SUMMARY  TRANSFER DIAGNOSES: 1.  Ascending aortic dissection.  2.  Hypokalemia.  3.  Asthma.  4.  Hypothyroidism.  5.  Gastroesophageal reflux disease.   HOSPITAL COURSE: The patient is a 54 year old Caucasian female with a history of asthma, hypothyroidism, esophagus stricture, GERD, who presented to the ED with dyspnea on exertion and chest tightness for 1 month and worsening since yesterday. The patient has had dyspnea on e and off for the past 2 years, but recently has been worsening. The patient also complains of dizziness, chest tightness and generalized weakness. The patient's heart rate with went up to 140 while walking in the ED, but the patient denies any cough, sputum, orthopnea or nocturnal dyspnea. The patient mentioned that she had some leg edema occasionally. The patient was admitted for dyspnea on exertion. Since the patient has elevated D-dimer at 1.06, CT angio was done. I was called by the radiologist informing me that the patient has ascending aortic dissection. I  immediately called [facility]. Discussed this with Dr. Ellis SavageKiser, [facility]. He kindly accepted the patient. He suggested that the patient will be transferred [facility]. I discussed with patient and her husband about the transfer [facility] ICU today. I also discussed the patient's critical condition with ED physician, Dr. Shaune PollackLord and charting nurse and nurse in 2A.  [facility].  TIME SPENT: About 57 minutes     ____________________________ Shaune PollackQing Mishika Flippen, MD qc:cc D: 01/05/2013 23:01:55 ET T: 01/05/2013 23:21:47 ET JOB#: [id number]  cc: Shaune PollackQing Whalen Trompeter, MD, <Dictator> Shaune PollackQING Zac Torti MD ELECTRONICALLY SIGNED 01/07/2013 16:28

## 2015-03-04 NOTE — H&P (Signed)
PATIENT NAME:  Lisa Robbins, Lisa Robbins MR#:  Robbins DATE OF BIRTH:  July 27, 1961  DATE OF ADMISSION:  01/05/2013  Addendum  This is an addendum to the admission dictation.  I discussed the patient's critical condition with the patient and the patient's husband. The patient was transferred to the CCU. The patient is now in the CCU, and the [facility].   Critical care time spent: About 57 minutes.     ____________________________ Shaune PollackQing Brayton Baumgartner, MD qc:dm D: 01/05/2013 23:24:00 ET T: 01/06/2013 07:50:47 ET JOB#: [id number]  cc: Shaune PollackQing Kavi Almquist, MD, <Dictator> Shaune PollackQING Marcellino Fidalgo MD ELECTRONICALLY SIGNED 01/07/2013 16:30

## 2015-07-10 ENCOUNTER — Other Ambulatory Visit: Payer: Self-pay | Admitting: Family Medicine

## 2015-08-07 ENCOUNTER — Other Ambulatory Visit: Payer: Self-pay | Admitting: Family Medicine

## 2015-09-02 DIAGNOSIS — I872 Venous insufficiency (chronic) (peripheral): Secondary | ICD-10-CM | POA: Insufficient documentation

## 2015-09-02 DIAGNOSIS — M797 Fibromyalgia: Secondary | ICD-10-CM | POA: Insufficient documentation

## 2015-09-02 DIAGNOSIS — E079 Disorder of thyroid, unspecified: Secondary | ICD-10-CM | POA: Insufficient documentation

## 2015-09-02 DIAGNOSIS — N951 Menopausal and female climacteric states: Secondary | ICD-10-CM | POA: Insufficient documentation

## 2015-09-02 DIAGNOSIS — Z87898 Personal history of other specified conditions: Secondary | ICD-10-CM | POA: Insufficient documentation

## 2015-09-02 DIAGNOSIS — K219 Gastro-esophageal reflux disease without esophagitis: Secondary | ICD-10-CM | POA: Insufficient documentation

## 2015-09-02 DIAGNOSIS — J45909 Unspecified asthma, uncomplicated: Secondary | ICD-10-CM | POA: Insufficient documentation

## 2015-09-02 DIAGNOSIS — J309 Allergic rhinitis, unspecified: Secondary | ICD-10-CM | POA: Insufficient documentation

## 2015-09-02 DIAGNOSIS — Z8739 Personal history of other diseases of the musculoskeletal system and connective tissue: Secondary | ICD-10-CM | POA: Insufficient documentation

## 2015-09-02 DIAGNOSIS — F411 Generalized anxiety disorder: Secondary | ICD-10-CM | POA: Insufficient documentation

## 2015-09-02 DIAGNOSIS — R760 Raised antibody titer: Secondary | ICD-10-CM | POA: Insufficient documentation

## 2015-09-02 DIAGNOSIS — G43909 Migraine, unspecified, not intractable, without status migrainosus: Secondary | ICD-10-CM | POA: Insufficient documentation

## 2015-09-02 DIAGNOSIS — G4700 Insomnia, unspecified: Secondary | ICD-10-CM | POA: Insufficient documentation

## 2015-09-12 ENCOUNTER — Encounter: Payer: Self-pay | Admitting: Family Medicine

## 2015-09-12 ENCOUNTER — Ambulatory Visit (INDEPENDENT_AMBULATORY_CARE_PROVIDER_SITE_OTHER): Payer: BLUE CROSS/BLUE SHIELD | Admitting: Family Medicine

## 2015-09-12 VITALS — BP 116/64 | HR 82 | Temp 97.5°F | Resp 16 | Wt 145.0 lb

## 2015-09-12 DIAGNOSIS — K219 Gastro-esophageal reflux disease without esophagitis: Secondary | ICD-10-CM

## 2015-09-12 DIAGNOSIS — Z8739 Personal history of other diseases of the musculoskeletal system and connective tissue: Secondary | ICD-10-CM | POA: Diagnosis not present

## 2015-09-12 DIAGNOSIS — G4700 Insomnia, unspecified: Secondary | ICD-10-CM

## 2015-09-12 DIAGNOSIS — E079 Disorder of thyroid, unspecified: Secondary | ICD-10-CM

## 2015-09-12 DIAGNOSIS — F411 Generalized anxiety disorder: Secondary | ICD-10-CM | POA: Diagnosis not present

## 2015-09-12 MED ORDER — LORAZEPAM 0.5 MG PO TABS
0.5000 mg | ORAL_TABLET | Freq: Every day | ORAL | Status: DC
Start: 2015-09-12 — End: 2016-12-27

## 2015-09-12 MED ORDER — AMITRIPTYLINE HCL 25 MG PO TABS: 25.0000 mg | ORAL_TABLET | Freq: Every day | ORAL | Status: DC

## 2015-09-12 MED ORDER — ESOMEPRAZOLE MAGNESIUM 40 MG PO CPDR: 40.0000 mg | DELAYED_RELEASE_CAPSULE | Freq: Every day | ORAL | Status: DC

## 2015-09-12 NOTE — Progress Notes (Signed)
Patient ID: Lisa Robbins, female   DOB: 1961/01/07, 54 y.o.   MRN: [id number]    Subjective:  HPI Pt is here for a follow and refills. She needs Nexium, Ativan and Amitriptyline. She reports that she is going well and all her medications are working well, without side effects.  Pt reports that she is still very weak and worn out all the time but this is nothing new for pt.  Prior to Admission medications   Medication Sig Start Date End Date Taking? Authorizing Provider  amitriptyline (ELAVIL) 25 MG tablet Take by mouth. 01/19/15  Yes Historical Provider, MD  esomeprazole (NEXIUM) 40 MG capsule TAKE ONE CAPSULE BY MOUTH DAILY 08/08/15  Yes Richard Hulen ShoutsL Gilbert Jr., MD  Glucosamine HCl-MSM (MSM GLUCOSAMINE) [phone] MG CAPS Take by mouth.   Yes Historical Provider, MD  LORazepam (ATIVAN) 0.5 MG tablet Take by mouth. 06/16/14  Yes Historical Provider, MD  Misc Natural Products (PROGESTERONE EX)    Yes Historical Provider, MD  salmeterol (SEREVENT DISKUS) 50 MCG/DOSE diskus inhaler Inhale into the lungs. 05/04/13  Yes Historical Provider, MD  TESTOSTERONE PROPIONATE TD Place onto the skin.   Yes Historical Provider, MD  thyroid (ARMOUR THYROID) 60 MG tablet Take by mouth. 05/04/13  Yes Historical Provider, MD  Cyanocobalamin 1000 MCG CAPS Take by mouth.    Historical Provider, MD    Patient Active Problem List   Diagnosis Date Noted  . Allergic rhinitis 09/02/2015  . Airway hyperreactivity 09/02/2015  . Abnormal antinuclear antibody titer 09/02/2015  . Fibrositis 09/02/2015  . Generalized anxiety disorder 09/02/2015  . Acid reflux 09/02/2015  . History of prolonged Q-T interval on ECG 09/02/2015  . Headache, migraine 09/02/2015  . H/O arthritis 09/02/2015  . Cannot sleep 09/02/2015  . Climacteric 09/02/2015  . Disorder of thyroid 09/02/2015  . Chronic venous insufficiency 09/02/2015    History reviewed. No pertinent past medical history.  Social History   Social History  . Marital  Status: Married    Spouse Name: N/A  . Number of Children: N/A  . Years of Education: N/A   Occupational History  . Not on file.   Social History Main Topics  . Smoking status: Never Smoker   . Smokeless tobacco: Not on file  . Alcohol Use: No  . Drug Use: No  . Sexual Activity: Not on file   Other Topics Concern  . Not on file   Social History Narrative    Allergies  Allergen Reactions  . Dust Mite Extract     Review of Systems  Constitutional: Positive for malaise/fatigue.  HENT: Negative.   Eyes: Negative.   Respiratory: Positive for shortness of breath.   Cardiovascular: Negative.   Gastrointestinal: Negative.   Genitourinary: Negative.   Musculoskeletal: Negative.   Skin: Negative.   Neurological: Positive for weakness (this is nothing new for patient. ).  Endo/Heme/Allergies: Negative.   Psychiatric/Behavioral: Negative.      There is no immunization history on file for this patient. Objective:  BP 116/64 mmHg  Pulse 82  Temp(Src) 97.5 F (36.4 C) (Oral)  Resp 16  Wt 145 lb (65.772 kg)  Physical Exam  Constitutional: She is oriented to person, place, and time and well-developed, well-nourished, and in no distress.  HENT:  Head: Normocephalic and atraumatic.  Right Ear: External ear normal.  Left Ear: External ear normal.  Nose: Nose normal.  Eyes: Conjunctivae and EOM are normal. Pupils are equal, round, and reactive to light.  Neck: Normal range  of motion. Neck supple.  Cardiovascular: Normal rate, regular rhythm, normal heart sounds and intact distal pulses.   Pulmonary/Chest: Effort normal and breath sounds normal.  Abdominal: Soft. Bowel sounds are normal.  Musculoskeletal: Normal range of motion.  Neurological: She is alert and oriented to person, place, and time. She has normal reflexes. Gait normal. GCS score is 15.  Skin: Skin is warm and dry.  Psychiatric: Mood, memory, affect and judgment normal.    Lab Results  Component Value  Date   WBC 6.1 01/05/2013   HGB 12.9 01/05/2013   HCT 39.8 01/05/2013   PLT 310 01/05/2013   GLUCOSE 111* 01/05/2013   CHOL 197 10/31/2012   TRIG 40 10/31/2012   HDL 98* 10/31/2012   LDLCALC 91 10/31/2012    CMP     Component Value Date/Time   NA 139 01/05/2013 1431   NA 138 10/31/2012   K 3.4* 01/05/2013 1431   K 4.1 10/31/2012   CL 106 01/05/2013 1431   CO2 29 01/05/2013 1431   GLUCOSE 111* 01/05/2013 1431   BUN 11 01/05/2013 1431   BUN 9 10/31/2012   CREATININE 0.68 01/05/2013 1431   CREATININE 0.7 10/31/2012   CALCIUM 9.1 01/05/2013 1431   AST 16 10/31/2012   ALT 12 10/31/2012   ALKPHOS 64 10/31/2012   GFRNONAA >60 01/05/2013 1431   GFRAA >60 01/05/2013 1431    Assessment and Plan :  1. Gastroesophageal reflux disease, esophagitis presence not specified  - esomeprazole (NEXIUM) 40 MG capsule; Take 1 capsule (40 mg total) by mouth daily.  Dispense: 30 capsule; Refill: 12  2. Disorder of thyroid/[place] Thyroiditis   3. Cannot sleep/chronic insomnia  - amitriptyline (ELAVIL) 25 MG tablet; Take 1 tablet (25 mg total) by mouth at bedtime.  Dispense: 30 tablet; Refill: 12 - LORazepam (ATIVAN) 0.5 MG tablet; Take 1 tablet (0.5 mg total) by mouth at bedtime.  Dispense: 30 tablet; Refill: 5  4. H/O arthritis   5. Generalized anxiety disorder  - amitriptyline (ELAVIL) 25 MG tablet; Take 1 tablet (25 mg total) by mouth at bedtime.  Dispense: 30 tablet; Refill: 12 - LORazepam (ATIVAN) 0.5 MG tablet; Take 1 tablet (0.5 mg total) by mouth at bedtime.  Dispense: 30 tablet; Refill: 5 6.fibromyalgia   Pt declined flu vaccine. Patient was seen and examined by Dr. Julieanne Manson, and noted scribed by Dimas Chyle, CMA   Julieanne Manson MD [facility] 09/12/2015 3:53 PM

## 2016-02-16 ENCOUNTER — Telehealth: Payer: Self-pay

## 2016-02-16 NOTE — Telephone Encounter (Signed)
Lmtcb, need to know her last mammogram and pap smear date-aa

## 2016-02-21 ENCOUNTER — Other Ambulatory Visit: Payer: Self-pay | Admitting: Family Medicine

## 2016-02-21 NOTE — Telephone Encounter (Signed)
Dr gilberts patient-aa 

## 2016-04-10 ENCOUNTER — Other Ambulatory Visit: Payer: Self-pay | Admitting: Family Medicine

## 2016-06-05 DIAGNOSIS — J45998 Other asthma: Secondary | ICD-10-CM | POA: Diagnosis not present

## 2016-06-05 DIAGNOSIS — R0602 Shortness of breath: Secondary | ICD-10-CM | POA: Diagnosis not present

## 2016-09-12 ENCOUNTER — Other Ambulatory Visit: Payer: Self-pay | Admitting: Family Medicine

## 2016-09-12 DIAGNOSIS — K219 Gastro-esophageal reflux disease without esophagitis: Secondary | ICD-10-CM

## 2016-09-22 ENCOUNTER — Other Ambulatory Visit: Payer: Self-pay | Admitting: Family Medicine

## 2016-09-22 DIAGNOSIS — K219 Gastro-esophageal reflux disease without esophagitis: Secondary | ICD-10-CM

## 2016-11-28 ENCOUNTER — Other Ambulatory Visit: Payer: Self-pay | Admitting: Family Medicine

## 2016-11-28 DIAGNOSIS — K219 Gastro-esophageal reflux disease without esophagitis: Secondary | ICD-10-CM

## 2016-12-17 DIAGNOSIS — E063 Autoimmune thyroiditis: Secondary | ICD-10-CM | POA: Diagnosis not present

## 2016-12-17 DIAGNOSIS — N943 Premenstrual tension syndrome: Secondary | ICD-10-CM | POA: Diagnosis not present

## 2016-12-17 DIAGNOSIS — E039 Hypothyroidism, unspecified: Secondary | ICD-10-CM | POA: Diagnosis not present

## 2016-12-17 DIAGNOSIS — E785 Hyperlipidemia, unspecified: Secondary | ICD-10-CM | POA: Diagnosis not present

## 2016-12-24 DIAGNOSIS — E785 Hyperlipidemia, unspecified: Secondary | ICD-10-CM | POA: Diagnosis not present

## 2016-12-24 DIAGNOSIS — E063 Autoimmune thyroiditis: Secondary | ICD-10-CM | POA: Diagnosis not present

## 2016-12-24 DIAGNOSIS — E039 Hypothyroidism, unspecified: Secondary | ICD-10-CM | POA: Diagnosis not present

## 2016-12-27 ENCOUNTER — Ambulatory Visit (INDEPENDENT_AMBULATORY_CARE_PROVIDER_SITE_OTHER): Payer: BLUE CROSS/BLUE SHIELD | Admitting: Family Medicine

## 2016-12-27 DIAGNOSIS — F411 Generalized anxiety disorder: Secondary | ICD-10-CM | POA: Diagnosis not present

## 2016-12-27 DIAGNOSIS — K219 Gastro-esophageal reflux disease without esophagitis: Secondary | ICD-10-CM | POA: Diagnosis not present

## 2016-12-27 DIAGNOSIS — G4700 Insomnia, unspecified: Secondary | ICD-10-CM | POA: Diagnosis not present

## 2016-12-27 MED ORDER — LORAZEPAM 0.5 MG PO TABS: 0.5000 mg | ORAL_TABLET | Freq: Every day | ORAL | 5 refills | Status: DC

## 2016-12-27 MED ORDER — ESOMEPRAZOLE MAGNESIUM 40 MG PO CPDR: 40.0000 mg | DELAYED_RELEASE_CAPSULE | Freq: Every day | ORAL | 6 refills | Status: DC

## 2016-12-27 MED ORDER — AMITRIPTYLINE HCL 25 MG PO TABS: 100.0000 mg | ORAL_TABLET | Freq: Every day | ORAL | 6 refills | Status: DC

## 2016-12-27 NOTE — Progress Notes (Signed)
Lisa KirschnerLaura W Robbins  MRN: [id number] DOB: 1961-04-05  Subjective:  HPI   The patient is a 56 year old female who presents for follow up of her chronic conditions.   GERD-The patient takes Nexium and reports good control of symptoms.  She sees Dr. Mechele CollinElliott and has not had to have endoscopy for about 7 years.  She states she has been doing very well. Insomnia-Improved and patient has tapered down on the Amitriptyline.  She had been taking 5 pills at night and is now down to 4 pills.  She believes her symptoms to be associated with hormones and states she is menopausal with symptoms improving.  The patient has Lorazepam to help with her insomnia when it gets real bad. She last used it in December and that was due to increased stress.  She states she is averaging use ever few months.   Patient Active Problem List   Diagnosis Date Noted  . Allergic rhinitis 09/02/2015  . Airway hyperreactivity 09/02/2015  . Abnormal antinuclear antibody titer 09/02/2015  . Fibrositis 09/02/2015  . Generalized anxiety disorder 09/02/2015  . Acid reflux 09/02/2015  . History of prolonged Q-T interval on ECG 09/02/2015  . Headache, migraine 09/02/2015  . H/O arthritis 09/02/2015  . Cannot sleep 09/02/2015  . Climacteric 09/02/2015  . Disorder of thyroid 09/02/2015  . Chronic venous insufficiency 09/02/2015    No past medical history on file.  Social History   Social History  . Marital status: Married    Spouse name: N/A  . Number of children: N/A  . Years of education: N/A   Occupational History  . Not on file.   Social History Main Topics  . Smoking status: Never Smoker  . Smokeless tobacco: Not on file  . Alcohol use No  . Drug use: No  . Sexual activity: Not on file   Other Topics Concern  . Not on file   Social History Narrative  . No narrative on file    Outpatient Encounter Prescriptions as of 12/27/2016  Medication Sig Note  . amitriptyline (ELAVIL) 25 MG tablet TAKE 5 TABLETS BY  MOUTH DAILY (Patient taking differently: TAKE 4 TABLETS BY MOUTH DAILY)   . Cyanocobalamin 1000 MCG CAPS Take by mouth. 09/02/2015: Received from: [facility]  . esomeprazole (NEXIUM) 40 MG capsule TAKE 1 CAPSULE (40 MG TOTAL) BY MOUTH DAILY.   Marland Kitchen. Glucosamine HCl-MSM (MSM GLUCOSAMINE) 375-375 MG CAPS Take by mouth. 09/02/2015: Received from: [facility]  . LORazepam (ATIVAN) 0.5 MG tablet Take 1 tablet (0.5 mg total) by mouth at bedtime.   . Misc Natural Products (PROGESTERONE EX)  09/02/2015: Received from: [facility]  . salmeterol (SEREVENT DISKUS) 50 MCG/DOSE diskus inhaler Inhale into the lungs. 09/02/2015: Received from: [facility]  . TESTOSTERONE PROPIONATE TD Place onto the skin. 09/02/2015: Received from: [facility]  . thyroid (ARMOUR THYROID) 60 MG tablet Take by mouth. 09/02/2015: Received from: [facility]  . [DISCONTINUED] esomeprazole (NEXIUM) 40 MG capsule TAKE 1 CAPSULE (40 MG TOTAL) BY MOUTH DAILY.    No facility-administered encounter medications on file as of 12/27/2016.     Allergies  Allergen Reactions  . Dust Mite Extract     Review of Systems  Constitutional: Positive for malaise/fatigue (patient thinks it is from her thyroid disease). Negative for fever.  Eyes: Negative.   Respiratory: Positive for shortness of breath (secondary to asthma, controlled no current issues). Negative for cough and wheezing.   Cardiovascular:  Negative for chest pain, palpitations, orthopnea and leg swelling.  Neurological: Negative for dizziness, weakness and headaches.  Endo/Heme/Allergies: Negative.   Psychiatric/Behavioral: Negative.  Negative for depression, hallucinations, memory loss, substance abuse and suicidal ideas. The patient is not nervous/anxious and does not have insomnia.     Objective:  BP 110/60 (BP Location: Right Arm, Patient Position: Sitting, Cuff Size:  Normal)   Pulse 84   Temp 97.8 F (36.6 C) (Oral)   Resp 16   Wt 147 lb (66.7 kg)   BMI 23.02 kg/m   Physical Exam  Constitutional: She is well-developed, well-nourished, and in no distress.  HENT:  Head: Normocephalic and atraumatic.  Eyes: Conjunctivae are normal. Pupils are equal, round, and reactive to light. No scleral icterus.  Neck: Normal range of motion. Neck supple. No thyromegaly present.  Cardiovascular: Normal rate, regular rhythm, normal heart sounds and intact distal pulses.   Pulmonary/Chest: Effort normal and breath sounds normal.  Abdominal: Soft.  Skin: Skin is warm and dry.  Psychiatric: Mood, memory, affect and judgment normal.    Assessment and Plan :  Chronic Insomnia Chronic Anxiety GERD Thyroid disease Thyroiditis followed by Dr. Rise Mu and  reflux is followed by Dr. Mechele Collin.  Medications are refilled for 1 year.  I have done the exam and reviewed the chart and it is accurate to the best of my knowledge. Dentist has been used and  any errors in dictation or transcription are unintentional. Julieanne Manson M.D. [facility]

## 2017-01-08 DIAGNOSIS — R760 Raised antibody titer: Secondary | ICD-10-CM | POA: Diagnosis not present

## 2017-01-08 DIAGNOSIS — R911 Solitary pulmonary nodule: Secondary | ICD-10-CM | POA: Diagnosis not present

## 2017-01-08 DIAGNOSIS — J4520 Mild intermittent asthma, uncomplicated: Secondary | ICD-10-CM | POA: Diagnosis not present

## 2017-04-24 ENCOUNTER — Other Ambulatory Visit: Payer: Self-pay | Admitting: Family Medicine

## 2017-07-23 ENCOUNTER — Other Ambulatory Visit: Payer: Self-pay | Admitting: Family Medicine

## 2017-07-23 DIAGNOSIS — K219 Gastro-esophageal reflux disease without esophagitis: Secondary | ICD-10-CM

## 2017-07-24 ENCOUNTER — Other Ambulatory Visit: Payer: Self-pay | Admitting: Family Medicine

## 2017-07-24 DIAGNOSIS — K219 Gastro-esophageal reflux disease without esophagitis: Secondary | ICD-10-CM

## 2017-10-08 DIAGNOSIS — E063 Autoimmune thyroiditis: Secondary | ICD-10-CM | POA: Diagnosis not present

## 2017-10-08 DIAGNOSIS — E039 Hypothyroidism, unspecified: Secondary | ICD-10-CM | POA: Diagnosis not present

## 2017-10-15 DIAGNOSIS — N943 Premenstrual tension syndrome: Secondary | ICD-10-CM | POA: Diagnosis not present

## 2017-10-15 DIAGNOSIS — E063 Autoimmune thyroiditis: Secondary | ICD-10-CM | POA: Diagnosis not present

## 2017-10-15 DIAGNOSIS — E039 Hypothyroidism, unspecified: Secondary | ICD-10-CM | POA: Diagnosis not present

## 2017-10-15 DIAGNOSIS — E785 Hyperlipidemia, unspecified: Secondary | ICD-10-CM | POA: Diagnosis not present

## 2017-10-19 ENCOUNTER — Other Ambulatory Visit: Payer: Self-pay | Admitting: Family Medicine

## 2017-10-19 DIAGNOSIS — K219 Gastro-esophageal reflux disease without esophagitis: Secondary | ICD-10-CM

## 2017-12-31 ENCOUNTER — Ambulatory Visit: Payer: BLUE CROSS/BLUE SHIELD | Admitting: Family Medicine

## 2017-12-31 VITALS — BP 130/80 | HR 84 | Temp 97.5°F | Resp 16 | Wt 148.0 lb

## 2017-12-31 DIAGNOSIS — R399 Unspecified symptoms and signs involving the genitourinary system: Secondary | ICD-10-CM

## 2017-12-31 LAB — POCT URINALYSIS DIPSTICK
Bilirubin, UA: NEGATIVE
Blood, UA: NEGATIVE
Glucose, UA: NEGATIVE
KETONES UA: NEGATIVE
Leukocytes, UA: NEGATIVE
NITRITE UA: NEGATIVE
PH UA: 6.5 (ref 5.0–8.0)
SPEC GRAV UA: 1.01 (ref 1.010–1.025)
Urobilinogen, UA: 0.2 E.U./dL

## 2017-12-31 NOTE — Progress Notes (Signed)
Lisa KirschnerLaura W Robbins  MRN: [id number] DOB: [birth year]  Subjective:  HPI   The patient is a 57 year old female who presents with possible urinary tract infection.   She has been having symptoms for two weeks and they include pelvic pain and pressure, burning with urination and back pain.  She took a home test that checks for the presence of leukocytes and states she got it positive twice. Patient states she has increased her fluid intake and had about 2 quarts of water before coming in.  She also states that she has used apple cider vinegar prior to today to see if that would help.  Patient Active Problem List   Diagnosis Date Noted  . Allergic rhinitis 09/02/2015  . Airway hyperreactivity 09/02/2015  . Abnormal antinuclear antibody titer 09/02/2015  . Fibrositis 09/02/2015  . Generalized anxiety disorder 09/02/2015  . Acid reflux 09/02/2015  . History of prolonged Q-T interval on ECG 09/02/2015  . Headache, migraine 09/02/2015  . H/O arthritis 09/02/2015  . Cannot sleep 09/02/2015  . Climacteric 09/02/2015  . Disorder of thyroid 09/02/2015  . Chronic venous insufficiency 09/02/2015    No past medical history on file.  Social History   Socioeconomic History  . Marital status: Married    Spouse name: Not on file  . Number of children: Not on file  . Years of education: Not on file  . Highest education level: Not on file  Social Needs  . Financial resource strain: Not on file  . Food insecurity - worry: Not on file  . Food insecurity - inability: Not on file  . Transportation needs - medical: Not on file  . Transportation needs - non-medical: Not on file  Occupational History  . Not on file  Tobacco Use  . Smoking status: Never Smoker  Substance and Sexual Activity  . Alcohol use: No  . Drug use: No  . Sexual activity: Not on file  Other Topics Concern  . Not on file  Social History Narrative  . Not on file    Outpatient Encounter Medications as of 12/31/2017    Medication Sig Note  . amitriptyline (ELAVIL) 25 MG tablet Take 4 tablets (100 mg total) by mouth daily. 12/31/2017: Patient is down to 2.5 daily  . Cyanocobalamin 1000 MCG CAPS Take by mouth. 09/02/2015: Received from: [facility]  . esomeprazole (NEXIUM) 40 MG capsule TAKE 1 CAPSULE BY MOUTH EVERY DAY   . Glucosamine HCl-MSM (MSM GLUCOSAMINE) 375-375 MG CAPS Take by mouth. 09/02/2015: Received from: [facility]  . LORazepam (ATIVAN) 0.5 MG tablet Take 1 tablet (0.5 mg total) by mouth at bedtime.   . salmeterol (SEREVENT DISKUS) 50 MCG/DOSE diskus inhaler Inhale into the lungs. 09/02/2015: Received from: [facility]  . thyroid (ARMOUR THYROID) 60 MG tablet Take by mouth. 09/02/2015: Received from: [facility]  . [DISCONTINUED] amitriptyline (ELAVIL) 25 MG tablet TAKE 5 TABLETS BY MOUTH DAILY   . [DISCONTINUED] Misc Natural Products (PROGESTERONE EX)  09/02/2015: Received from: [facility]  . [DISCONTINUED] TESTOSTERONE PROPIONATE TD Place onto the skin. 09/02/2015: Received from: [facility]   No facility-administered encounter medications on file as of 12/31/2017.     Allergies  Allergen Reactions  . Dust Mite Extract     Review of Systems  Constitutional: Positive for malaise/fatigue. Negative for fever.  Eyes: Negative.   Gastrointestinal: Negative.   Genitourinary: Positive for dysuria (burning), flank pain (relieved with increased fluid intake. ), frequency and  urgency. Negative for hematuria.  Skin: Negative.   Neurological: Positive for weakness.  Endo/Heme/Allergies: Negative.   Psychiatric/Behavioral: Negative.     Objective:  BP 130/80 (BP Location: Right Arm, Patient Position: Sitting, Cuff Size: Normal)   Pulse 84   Temp (!) 97.5 F (36.4 C) (Oral)   Resp 16   Wt 148 lb (67.1 kg)   BMI 23.18 kg/m   Urinalysis    Component Value Date/Time   BILIRUBINUR neg  12/31/2017 1107   PROTEINUR eg 12/31/2017 1107   UROBILINOGEN 0.2 12/31/2017 1107   NITRITE neg 12/31/2017 1107   LEUKOCYTESUR Negative 12/31/2017 1107     Physical Exam  Constitutional: She is oriented to person, place, and time and well-developed, well-nourished, and in no distress.  HENT:  Head: Normocephalic and atraumatic.  Right Ear: External ear normal.  Left Ear: External ear normal.  Nose: Nose normal.  Eyes: Pupils are equal, round, and reactive to light. No scleral icterus.  Neck: Normal range of motion. Neck supple. No thyromegaly present.  Cardiovascular: Normal rate, regular rhythm and normal heart sounds.  Pulmonary/Chest: Effort normal and breath sounds normal.  Abdominal: Soft.  Neurological: She is alert and oriented to person, place, and time. Gait normal. GCS score is 15.  Skin: Skin is warm and dry.  Psychiatric: Mood, memory, affect and judgment normal.    Assessment and Plan :  1. UTI symptoms Refer to Gyn if culture negative. No indication of active infection. - POCT urinalysis dipstick - Urine Culture; Future - Urine Culture 2.Chronic Anxiety

## 2018-01-02 LAB — URINE CULTURE: Organism ID, Bacteria: NO GROWTH

## 2018-01-02 NOTE — Progress Notes (Signed)
Advised  Ed 

## 2018-01-18 ENCOUNTER — Other Ambulatory Visit: Payer: Self-pay | Admitting: Family Medicine

## 2018-01-18 DIAGNOSIS — K219 Gastro-esophageal reflux disease without esophagitis: Secondary | ICD-10-CM

## 2018-03-16 ENCOUNTER — Other Ambulatory Visit: Payer: Self-pay | Admitting: Family Medicine

## 2018-04-14 DIAGNOSIS — D2372 Other benign neoplasm of skin of left lower limb, including hip: Secondary | ICD-10-CM | POA: Diagnosis not present

## 2018-04-14 DIAGNOSIS — I781 Nevus, non-neoplastic: Secondary | ICD-10-CM | POA: Diagnosis not present

## 2018-04-14 DIAGNOSIS — D2271 Melanocytic nevi of right lower limb, including hip: Secondary | ICD-10-CM | POA: Diagnosis not present

## 2018-04-14 DIAGNOSIS — D485 Neoplasm of uncertain behavior of skin: Secondary | ICD-10-CM | POA: Diagnosis not present

## 2018-04-14 DIAGNOSIS — B078 Other viral warts: Secondary | ICD-10-CM | POA: Diagnosis not present

## 2018-04-14 DIAGNOSIS — I8393 Asymptomatic varicose veins of bilateral lower extremities: Secondary | ICD-10-CM | POA: Diagnosis not present

## 2018-04-30 DIAGNOSIS — E039 Hypothyroidism, unspecified: Secondary | ICD-10-CM | POA: Diagnosis not present

## 2018-04-30 DIAGNOSIS — E063 Autoimmune thyroiditis: Secondary | ICD-10-CM | POA: Diagnosis not present

## 2018-06-30 ENCOUNTER — Ambulatory Visit: Payer: Self-pay | Admitting: Internal Medicine

## 2018-07-05 ENCOUNTER — Other Ambulatory Visit: Payer: Self-pay | Admitting: Internal Medicine

## 2018-09-30 ENCOUNTER — Other Ambulatory Visit: Payer: Self-pay | Admitting: Internal Medicine

## 2018-09-30 ENCOUNTER — Telehealth: Payer: Self-pay

## 2018-09-30 NOTE — Telephone Encounter (Signed)
lmom pt need appt for refills  

## 2018-09-30 NOTE — Telephone Encounter (Signed)
lmom pt need appt refills

## 2018-12-05 ENCOUNTER — Other Ambulatory Visit: Payer: Self-pay | Admitting: Family Medicine

## 2018-12-05 DIAGNOSIS — K219 Gastro-esophageal reflux disease without esophagitis: Secondary | ICD-10-CM

## 2018-12-29 DIAGNOSIS — J019 Acute sinusitis, unspecified: Secondary | ICD-10-CM | POA: Diagnosis not present

## 2019-01-29 ENCOUNTER — Other Ambulatory Visit: Payer: Self-pay | Admitting: Adult Health

## 2019-01-29 MED ORDER — SALMETEROL XINAFOATE 50 MCG/DOSE IN AEPB: INHALATION_SPRAY | RESPIRATORY_TRACT | 0 refills | Status: DC

## 2019-02-02 ENCOUNTER — Other Ambulatory Visit: Payer: Self-pay

## 2019-02-02 DIAGNOSIS — G4700 Insomnia, unspecified: Secondary | ICD-10-CM

## 2019-02-02 NOTE — Telephone Encounter (Signed)
Patient is requesting a refill on her amitriptyline 25 mg. Please advise.

## 2019-02-04 MED ORDER — AMITRIPTYLINE HCL 25 MG PO TABS: 100.0000 mg | ORAL_TABLET | Freq: Every day | ORAL | 0 refills | Status: DC

## 2019-02-04 MED ORDER — AMITRIPTYLINE HCL 25 MG PO TABS
125.0000 mg | ORAL_TABLET | Freq: Every day | ORAL | 0 refills | Status: DC
Start: 2019-02-04 — End: 2019-03-27

## 2019-02-04 NOTE — Telephone Encounter (Signed)
Please Review

## 2019-03-27 ENCOUNTER — Other Ambulatory Visit: Payer: Self-pay | Admitting: Family Medicine

## 2019-03-27 NOTE — Telephone Encounter (Signed)
Please review

## 2019-04-22 ENCOUNTER — Other Ambulatory Visit: Payer: Self-pay | Admitting: Family Medicine

## 2019-04-25 ENCOUNTER — Other Ambulatory Visit: Payer: Self-pay | Admitting: Internal Medicine

## 2019-05-19 ENCOUNTER — Other Ambulatory Visit: Payer: Self-pay

## 2019-05-19 ENCOUNTER — Encounter: Payer: Self-pay | Admitting: Internal Medicine

## 2019-05-19 ENCOUNTER — Ambulatory Visit: Payer: BC Managed Care – PPO | Admitting: Internal Medicine

## 2019-05-19 VITALS — BP 139/82 | HR 55 | Resp 16 | Ht 68.0 in | Wt 149.0 lb

## 2019-05-19 DIAGNOSIS — J4520 Mild intermittent asthma, uncomplicated: Secondary | ICD-10-CM | POA: Diagnosis not present

## 2019-05-19 DIAGNOSIS — R0602 Shortness of breath: Secondary | ICD-10-CM

## 2019-05-19 MED ORDER — SEREVENT DISKUS 50 MCG/DOSE IN AEPB: INHALATION_SPRAY | RESPIRATORY_TRACT | 3 refills | Status: DC

## 2019-05-19 MED ORDER — ALBUTEROL SULFATE HFA 108 (90 BASE) MCG/ACT IN AERS: 2.0000 | INHALATION_SPRAY | Freq: Four times a day (QID) | RESPIRATORY_TRACT | 6 refills | Status: DC | PRN

## 2019-05-19 NOTE — Progress Notes (Signed)
[facility] [address] [place], [place] [id number]  Pulmonary Sleep Medicine   Office Visit Note  Patient Name: Lisa KirschnerLaura W Hellen DOB: 05/12/1961 MRN [id number]  Date of Service: 05/19/2019  Complaints/HPI: Pt is here for pulmonary follow up.  She reports a history of asthma for which she uses albuterol inhalers for. She also has Serevent Diskus, and is in need of a refill at this time. Overall she is doing well, Denies any other needs at this time.    ROS  General: (-) fever, (-) chills, (-) night sweats, (-) weakness Skin: (-) rashes, (-) itching,. Eyes: (-) visual changes, (-) redness, (-) itching. Nose and Sinuses: (-) nasal stuffiness or itchiness, (-) postnasal drip, (-) nosebleeds, (-) sinus trouble. Mouth and Throat: (-) sore throat, (-) hoarseness. Neck: (-) swollen glands, (-) enlarged thyroid, (-) neck pain. Respiratory: - cough, (-) bloody sputum, - shortness of breath, - wheezing. Cardiovascular: - ankle swelling, (-) chest pain. Lymphatic: (-) lymph node enlargement. Neurologic: (-) numbness, (-) tingling. Psychiatric: (-) anxiety, (-) depression   Current Medication: Outpatient Encounter Medications as of 05/19/2019  Medication Sig Note  . amitriptyline (ELAVIL) 25 MG tablet TAKE 5 TABLETS BY MOUTH DAILY   . esomeprazole (NEXIUM) 40 MG capsule TAKE 1 CAPSULE BY MOUTH EVERY DAY   . Glucosamine HCl-MSM (MSM GLUCOSAMINE) 375-375 MG CAPS Take by mouth. 09/02/2015: Received from: [facility]  . LORazepam (ATIVAN) 0.5 MG tablet Take 1 tablet (0.5 mg total) by mouth at bedtime.   . Multiple Vitamin (MULTIVITAMIN) capsule Take 1 capsule by mouth daily.   . Probiotic Product (PRO-BIOTIC BLEND PO) Take by mouth.   . salmeterol (SEREVENT DISKUS) 50 MCG/DOSE diskus inhaler USE ONE PUFF BY MOUTH EVERY 12 HOURS   . thyroid (ARMOUR THYROID) 60 MG tablet Take by mouth. 09/02/2015: Received from: [facility]  . Cyanocobalamin 1000  MCG CAPS Take by mouth. 09/02/2015: Received from: [facility]   No facility-administered encounter medications on file as of 05/19/2019.     Surgical History: Past Surgical History:  Procedure Laterality Date  . DILATION AND CURETTAGE OF UTERUS    . TONSILLECTOMY      Medical History: Past Medical History:  Diagnosis Date  . Asthma   . Thyroid disease     Family History: Family History  Problem Relation Age of Onset  . Stroke Mother   . Hypertension Mother   . Diabetes Father     Social History: Social History   Socioeconomic History  . Marital status: Married    Spouse name: Not on file  . Number of children: Not on file  . Years of education: Not on file  . Highest education level: Not on file  Occupational History  . Not on file  Social Needs  . Financial resource strain: Not on file  . Food insecurity    Worry: Not on file    Inability: Not on file  . Transportation needs    Medical: Not on file    Non-medical: Not on file  Tobacco Use  . Smoking status: Never Smoker  Substance and Sexual Activity  . Alcohol use: No  . Drug use: No  . Sexual activity: Not on file  Lifestyle  . Physical activity    Days per week: Not on file    Minutes per session: Not on file  . Stress: Not on file  Relationships  . Social connections    Talks on phone: Not on file  Gets together: Not on file    Attends religious service: Not on file    Active member of club or organization: Not on file    Attends meetings of clubs or organizations: Not on file    Relationship status: Not on file  . Intimate partner violence    Fear of current or ex partner: Not on file    Emotionally abused: Not on file    Physically abused: Not on file    Forced sexual activity: Not on file  Other Topics Concern  . Not on file  Social History Narrative  . Not on file    Vital Signs: Blood pressure 139/82, pulse (!) 55, resp. rate 16, height 5\' 8"  (1.727 m), weight  149 lb (67.6 kg), SpO2 95 %.  Examination: General Appearance: The patient is well-developed, well-nourished, and in no distress. Skin: Gross inspection of skin unremarkable. Head: normocephalic, no gross deformities. Eyes: no gross deformities noted. ENT: ears appear grossly normal no exudates. Neck: Supple. No thyromegaly. No LAD. Respiratory: clear bilaterally. Cardiovascular: Normal S1 and S2 without murmur or rub. Extremities: No cyanosis. pulses are equal. Neurologic: Alert and oriented. No involuntary movements.  LABS: No results found for this or any previous visit (from the past 2160 hour(s)).  Radiology: Dg Chest 2 View  Result Date: 01/05/2013 * PRIOR REPORT IMPORTED FROM AN EXTERNAL SYSTEM * PRIOR REPORT IMPORTED FROM THE SYNGO WORKFLOW SYSTEM REASON FOR EXAM:    SOB COMMENTS: PROCEDURE:     DXR - DXR CHEST PA (OR AP) AND LATERAL  - Jan 05 2013  2:57PM RESULT:     Lungs clear. Heart size normal. IMPRESSION:      No acute abnormality.    Ct Chest W Contrast  Result Date: 01/05/2013 * PRIOR REPORT IMPORTED FROM AN EXTERNAL SYSTEM * PRIOR REPORT IMPORTED FROM THE SYNGO WORKFLOW SYSTEM REASON FOR EXAM:    dyspnea on excertion COMMENTS: PROCEDURE:     CT  - CT CHEST (FOR PE) W  - Jan 05 2013  9:08PM RESULT:     History: Chest tightness. Comparison Study: Chest x-ray of 01/05/2013. Findings: Standard CT obtained under cc of [id number]. Aortic dissection of the ascending aorta  cannot be excluded. Although motion artifact could present in this fashion again a dissection cannot be excluded. Involvement of the coronary arteries cannot be excluded. Maximum diameter of the ascending aorta  4.5 cm .Descending thoracic aorta and upper abdominal aorta normal. Great vessels are normal. Pulmonary arteries are normal. Heart size normal. Adrenals normal. Kidneys unremarkable. Large airways patent. Lungs are clear. IMPRESSION:      Possible aortic dissection involving the ascending thoracic aorta.  Cardio thoracic surgery consultation suggested. Report phoned to patient's physician at time of procedure.     No results found.  No results found.    Assessment and Plan: Patient Active Problem List   Diagnosis Date Noted  . Allergic rhinitis 09/02/2015  . Airway hyperreactivity 09/02/2015  . Abnormal antinuclear antibody titer 09/02/2015  . Fibrositis 09/02/2015  . Generalized anxiety disorder 09/02/2015  . Acid reflux 09/02/2015  . History of prolonged Q-T interval on ECG 09/02/2015  . Headache, migraine 09/02/2015  . H/O arthritis 09/02/2015  . Cannot sleep 09/02/2015  . Climacteric 09/02/2015  . Disorder of thyroid 09/02/2015  . Chronic venous insufficiency 09/02/2015    1. Mild intermittent asthma without complication Continue with albuterol and using Serevent as directed.  - albuterol (VENTOLIN HFA) 108 (90 Base) MCG/ACT inhaler; Inhale 2 puffs  into the lungs every 6 (six) hours as needed for wheezing or shortness of breath.  Dispense: 18 g; Refill: 6 - salmeterol (SEREVENT DISKUS) 50 MCG/DOSE diskus inhaler; USE ONE PUFF BY MOUTH EVERY 12 HOURS  Dispense: 1 Inhaler; Refill: 3  2. Shortness of breath FEV1 is 1.6 which is 54% of pre-predicted value.  - Spirometry with Graph  General Counseling: I have discussed the findings of the evaluation and examination with Vernona RiegerLaura.  I have also discussed any further diagnostic evaluation thatmay be needed or ordered today. Vernona RiegerLaura verbalizes understanding of the findings of todays visit. We also reviewed her medications today and discussed drug interactions and side effects including but not limited excessive drowsiness and altered mental states. We also discussed that there is always a risk not just to her but also people around her. she has been encouraged to call the office with any questions or concerns that should arise related to todays visit.    Time spent: 30 This patient was seen by Blima LedgerAdam Dreshon Proffit AGNP-C in Collaboration with  Dr. Freda MunroSaadat Khan as a part of collaborative care agreement.   I have personally obtained a history, examined the patient, evaluated laboratory and imaging results, formulated the assessment and plan and placed orders.    Yevonne PaxSaadat A Khan, MD [facility] Pulmonary and Critical Care Sleep medicine

## 2019-08-04 ENCOUNTER — Other Ambulatory Visit: Payer: Self-pay

## 2019-08-04 MED ORDER — ASMANEX (120 METERED DOSES) 220 MCG/INH IN AEPB: 2.0000 | INHALATION_SPRAY | Freq: Two times a day (BID) | RESPIRATORY_TRACT | 3 refills | Status: DC

## 2019-08-29 ENCOUNTER — Other Ambulatory Visit: Payer: Self-pay | Admitting: Adult Health

## 2019-08-29 DIAGNOSIS — J4520 Mild intermittent asthma, uncomplicated: Secondary | ICD-10-CM

## 2019-09-23 DIAGNOSIS — F3281 Premenstrual dysphoric disorder: Secondary | ICD-10-CM | POA: Diagnosis not present

## 2019-09-23 DIAGNOSIS — E039 Hypothyroidism, unspecified: Secondary | ICD-10-CM | POA: Diagnosis not present

## 2019-09-23 DIAGNOSIS — G4700 Insomnia, unspecified: Secondary | ICD-10-CM | POA: Diagnosis not present

## 2019-09-23 DIAGNOSIS — E559 Vitamin D deficiency, unspecified: Secondary | ICD-10-CM | POA: Diagnosis not present

## 2019-09-23 DIAGNOSIS — Z1321 Encounter for screening for nutritional disorder: Secondary | ICD-10-CM | POA: Diagnosis not present

## 2019-09-23 DIAGNOSIS — N943 Premenstrual tension syndrome: Secondary | ICD-10-CM | POA: Diagnosis not present

## 2019-09-23 DIAGNOSIS — E042 Nontoxic multinodular goiter: Secondary | ICD-10-CM | POA: Diagnosis not present

## 2019-09-23 DIAGNOSIS — E063 Autoimmune thyroiditis: Secondary | ICD-10-CM | POA: Diagnosis not present

## 2019-09-23 DIAGNOSIS — K219 Gastro-esophageal reflux disease without esophagitis: Secondary | ICD-10-CM | POA: Diagnosis not present

## 2019-09-23 DIAGNOSIS — R5381 Other malaise: Secondary | ICD-10-CM | POA: Diagnosis not present

## 2019-09-30 DIAGNOSIS — E785 Hyperlipidemia, unspecified: Secondary | ICD-10-CM | POA: Diagnosis not present

## 2019-09-30 DIAGNOSIS — E063 Autoimmune thyroiditis: Secondary | ICD-10-CM | POA: Diagnosis not present

## 2019-09-30 DIAGNOSIS — E042 Nontoxic multinodular goiter: Secondary | ICD-10-CM | POA: Diagnosis not present

## 2019-09-30 DIAGNOSIS — N943 Premenstrual tension syndrome: Secondary | ICD-10-CM | POA: Diagnosis not present

## 2019-09-30 DIAGNOSIS — E039 Hypothyroidism, unspecified: Secondary | ICD-10-CM | POA: Diagnosis not present

## 2019-09-30 DIAGNOSIS — G4700 Insomnia, unspecified: Secondary | ICD-10-CM | POA: Diagnosis not present

## 2019-09-30 DIAGNOSIS — K219 Gastro-esophageal reflux disease without esophagitis: Secondary | ICD-10-CM | POA: Diagnosis not present

## 2019-11-19 ENCOUNTER — Telehealth: Payer: Self-pay

## 2019-11-19 NOTE — Telephone Encounter (Signed)
Confirmed pt appt for 11/24/2019. beth 

## 2019-11-23 ENCOUNTER — Telehealth: Payer: Self-pay

## 2019-11-23 NOTE — Telephone Encounter (Signed)
Patient not feeling well cancelled appointment on 11/24/2019, will callback to reschedule appointment. klh

## 2019-11-24 ENCOUNTER — Other Ambulatory Visit: Payer: Self-pay | Admitting: Family Medicine

## 2019-11-24 ENCOUNTER — Ambulatory Visit: Payer: BC Managed Care – PPO | Admitting: Internal Medicine

## 2019-11-24 DIAGNOSIS — K219 Gastro-esophageal reflux disease without esophagitis: Secondary | ICD-10-CM

## 2019-11-24 NOTE — Telephone Encounter (Signed)
Requested medication (s) are due for refill today: yes  Requested medication (s) are on the active medication list: yes  Last refill: 08/28/2019  Future visit scheduled: yes  Notes to clinic: review for refill No valid encounter within last 12 months   Requested Prescriptions  Pending Prescriptions Disp Refills   esomeprazole (NEXIUM) 40 MG capsule [Pharmacy Med Name: ESOMEPRAZOLE MAG DR 40 MG CAP] 90 capsule 3    Sig: TAKE 1 CAPSULE BY MOUTH EVERY DAY      Gastroenterology: Proton Pump Inhibitors Failed - 11/24/2019  1:52 AM      Failed - Valid encounter within last 12 months    Recent Outpatient Visits           1 year ago UTI symptoms   [facility] Maple Hudson., MD   2 years ago Gastroesophageal reflux disease, esophagitis presence not specified   [facility] Maple Hudson., MD   4 years ago Gastroesophageal reflux disease, esophagitis presence not specified   [facility] Maple Hudson., MD       Future Appointments             In 6 days Maple Hudson., MD [facility]

## 2019-11-24 NOTE — Telephone Encounter (Signed)
Please advise refill? Patient has not been seen since 12/27/2016. She does have an ov scheduled for 11/30/2019.

## 2019-11-24 NOTE — Telephone Encounter (Signed)
Requested medication (s) are due for refill today:yes  Requested medication (s) are on the active medication list: yes  Last refill:  08/19/2019  Future visit scheduled: yes  Notes to clinic:  Patient has upcoming appointment on 11/30/2019   Requested Prescriptions  Pending Prescriptions Disp Refills   amitriptyline (ELAVIL) 25 MG tablet [Pharmacy Med Name: AMITRIPTYLINE HCL 25 MG TAB] 450 tablet 1    Sig: TAKE 5 TABLETS BY MOUTH DAILY      Psychiatry:  Antidepressants - Heterocyclics (TCAs) Failed - 11/24/2019  1:53 AM      Failed - Valid encounter within last 6 months    Recent Outpatient Visits           1 year ago UTI symptoms   [facility] Maple Hudson., MD   2 years ago Gastroesophageal reflux disease, esophagitis presence not specified   [facility] Maple Hudson., MD   4 years ago Gastroesophageal reflux disease, esophagitis presence not specified   [facility] Maple Hudson., MD       Future Appointments             In 6 days Maple Hudson., MD [facility]

## 2019-11-25 ENCOUNTER — Telehealth: Payer: Self-pay | Admitting: Family Medicine

## 2019-11-25 DIAGNOSIS — K219 Gastro-esophageal reflux disease without esophagitis: Secondary | ICD-10-CM

## 2019-11-25 MED ORDER — ESOMEPRAZOLE MAGNESIUM 40 MG PO CPDR: DELAYED_RELEASE_CAPSULE | ORAL | 3 refills | Status: DC

## 2019-11-25 NOTE — Telephone Encounter (Signed)
Pt requested to see Dr. Sullivan Lone when he returns in office.    She is requesting her esomeprazole (NEXIUM) 40 MG capsule be filled prior to her Jan 26th appt or enough to last until the appt since she is almost out.  Thanks, [employer]

## 2019-11-30 ENCOUNTER — Ambulatory Visit: Payer: BLUE CROSS/BLUE SHIELD | Admitting: Family Medicine

## 2019-12-04 NOTE — Progress Notes (Signed)
Patient: Lisa Robbins Female    DOB: 1961/04/29   59 y.o.   MRN: [id number] Visit Date: 12/08/2019  Today's Provider: Wilhemena Durie, MD   Chief Complaint  Patient presents with  . Follow-up  . Anxiety  . Insomnia  . Hypothyroidism  . Gastroesophageal Reflux   Subjective:     HPI  Pt sees Dr Carmela Rima for her primary care.Overall she feels well and things are stable. Gastroesophageal reflux disease, esophagitis presence not specified From 12/27/2016-currently taking esomeprazole (NEXIUM) 40 MG capsule.  Disorder of thyroid/Hashimotos Thyroiditis From 12/27/2016-no current labs in chart.  Cannot sleep/chronic insomnia From 12/27/2016-given rx's for amitriptyline (ELAVIL) 25 MG and LORazepam (ATIVAN) 0.5 MG.  Generalized anxiety disorder From 12/27/2016-given rx's for amitriptyline (ELAVIL) 25 MG and LORazepam (ATIVAN) 0.5 MG.   Allergies  Allergen Reactions  . Dust Mite Extract      Current Outpatient Medications:  .  amitriptyline (ELAVIL) 25 MG tablet, TAKE 5 TABLETS BY MOUTH DAILY, Disp: 30 tablet, Rfl: 0 .  esomeprazole (NEXIUM) 40 MG capsule, TAKE 1 CAPSULE BY MOUTH EVERY DAY, Disp: 90 capsule, Rfl: 3 .  LORazepam (ATIVAN) 0.5 MG tablet, Take 1 tablet (0.5 mg total) by mouth at bedtime., Disp: 30 tablet, Rfl: 5 .  mometasone (ASMANEX, 120 METERED DOSES,) 220 MCG/INH inhaler, Inhale 2 puffs into the lungs 2 (two) times daily., Disp: 1 Inhaler, Rfl: 3 .  Multiple Vitamin (MULTIVITAMIN) capsule, Take 1 capsule by mouth daily., Disp: , Rfl:  .  Probiotic Product (PRO-BIOTIC BLEND PO), Take by mouth., Disp: , Rfl:  .  SEREVENT DISKUS 50 MCG/DOSE diskus inhaler, USE ONE PUFF BY MOUTH EVERY 12 HOURS, Disp: 180 each, Rfl: 1 .  thyroid (ARMOUR THYROID) 60 MG tablet, Take by mouth., Disp: , Rfl:  .  albuterol (VENTOLIN HFA) 108 (90 Base) MCG/ACT inhaler, Inhale 2 puffs into the lungs every 6 (six) hours as needed for wheezing or shortness of breath. (Patient not  taking: Reported on 12/08/2019), Disp: 18 g, Rfl: 6 .  Cyanocobalamin 1000 MCG CAPS, Take by mouth., Disp: , Rfl:  .  Glucosamine HCl-MSM (MSM GLUCOSAMINE) 375-375 MG CAPS, Take by mouth., Disp: , Rfl:   Review of Systems  Constitutional: Negative.  Negative for appetite change, chills, fatigue and fever.  HENT: Negative.   Eyes: Negative.   Respiratory: Negative for chest tightness and shortness of breath.   Cardiovascular: Negative for chest pain and palpitations.  Gastrointestinal: Negative for abdominal pain, nausea and vomiting.  Endocrine: Negative.   Allergic/Immunologic: Negative.   Neurological: Negative for dizziness and weakness.  Hematological: Negative.   Psychiatric/Behavioral: Negative.     Social History   Tobacco Use  . Smoking status: Never Smoker  . Smokeless tobacco: Never Used  Substance Use Topics  . Alcohol use: No      Objective:   BP 125/74 (BP Location: Left Arm, Patient Position: Sitting, Cuff Size: Large)   Pulse 85   Temp (!) 96.9 F (36.1 C) (Other (Comment))   Resp 16   Ht 5\' 8"  (1.727 m)   Wt 148 lb (67.1 kg)   SpO2 97%   BMI 22.50 kg/m  Vitals:   12/08/19 1443  BP: 125/74  Pulse: 85  Resp: 16  Temp: (!) 96.9 F (36.1 C)  TempSrc: Other (Comment)  SpO2: 97%  Weight: 148 lb (67.1 kg)  Height: 5\' 8"  (1.727 m)  Body mass index is 22.5 kg/m.   Physical Exam Vitals reviewed.  Constitutional:      Appearance: Normal appearance.  HENT:     Head: Normocephalic and atraumatic.     Right Ear: External ear normal.     Left Ear: External ear normal.  Eyes:     General: No scleral icterus.    Conjunctiva/sclera: Conjunctivae normal.  Cardiovascular:     Rate and Rhythm: Normal rate and regular rhythm.     Heart sounds: Normal heart sounds.  Pulmonary:     Breath sounds: Normal breath sounds.  Abdominal:     Palpations: Abdomen is soft.  Skin:    General: Skin is warm and dry.  Neurological:     General: No focal deficit  present.     Mental Status: She is alert and oriented to person, place, and time.  Psychiatric:        Mood and Affect: Mood normal.        Behavior: Behavior normal.        Thought Content: Thought content normal.        Judgment: Judgment normal.      No results found for any visits on 12/08/19.     Assessment & Plan    1. Fibrositis Refill for 1 year. - amitriptyline (ELAVIL) 25 MG tablet; Take 5 tablets (125 mg total) by mouth daily.  Dispense: 90 tablet; Refill: 3  2. Gastroesophageal reflux disease Followed by GI. - esomeprazole (NEXIUM) 40 MG capsule; TAKE 1 CAPSULE BY MOUTH EVERY DAY  Dispense: 90 capsule; Refill: 3  3. Insomnia, unspecified type Pt says she has not refilled for 1 year. - LORazepam (ATIVAN) 0.5 MG tablet; Take 1 tablet (0.5 mg total) by mouth at bedtime.  Dispense: 30 tablet; Refill: 2 4.Thyroid Disease 5.Postmenopausal    Richard Wendelyn Breslow, MD  [facility]

## 2019-12-08 ENCOUNTER — Encounter: Payer: Self-pay | Admitting: Family Medicine

## 2019-12-08 ENCOUNTER — Ambulatory Visit: Payer: BC Managed Care – PPO | Admitting: Family Medicine

## 2019-12-08 ENCOUNTER — Other Ambulatory Visit: Payer: Self-pay

## 2019-12-08 VITALS — BP 125/74 | HR 85 | Temp 96.9°F | Resp 16 | Ht 68.0 in | Wt 148.0 lb

## 2019-12-08 DIAGNOSIS — K219 Gastro-esophageal reflux disease without esophagitis: Secondary | ICD-10-CM | POA: Diagnosis not present

## 2019-12-08 DIAGNOSIS — G4700 Insomnia, unspecified: Secondary | ICD-10-CM

## 2019-12-08 DIAGNOSIS — E079 Disorder of thyroid, unspecified: Secondary | ICD-10-CM | POA: Diagnosis not present

## 2019-12-08 DIAGNOSIS — N951 Menopausal and female climacteric states: Secondary | ICD-10-CM

## 2019-12-08 DIAGNOSIS — M797 Fibromyalgia: Secondary | ICD-10-CM

## 2019-12-12 MED ORDER — ESOMEPRAZOLE MAGNESIUM 40 MG PO CPDR: DELAYED_RELEASE_CAPSULE | ORAL | 3 refills | Status: DC

## 2019-12-12 MED ORDER — AMITRIPTYLINE HCL 25 MG PO TABS: 125.0000 mg | ORAL_TABLET | Freq: Every day | ORAL | 3 refills | Status: DC

## 2019-12-12 MED ORDER — LORAZEPAM 0.5 MG PO TABS: 0.5000 mg | ORAL_TABLET | Freq: Every day | ORAL | 2 refills | Status: DC

## 2019-12-15 ENCOUNTER — Other Ambulatory Visit: Payer: Self-pay | Admitting: Family Medicine

## 2019-12-15 DIAGNOSIS — G4700 Insomnia, unspecified: Secondary | ICD-10-CM

## 2019-12-15 NOTE — Telephone Encounter (Signed)
The class for the Rx is PRINT and needs to be resent to pharmacy / LORazepam (ATIVAN) 0.5 MG tablet    Pt also wanted to advise that her endocrinologist will be sending over blood work she had done at their office/ please advise

## 2019-12-16 MED ORDER — LORAZEPAM 0.5 MG PO TABS: 0.5000 mg | ORAL_TABLET | Freq: Every day | ORAL | 2 refills | Status: DC

## 2019-12-16 NOTE — Telephone Encounter (Signed)
Please resend rx, last one did not go through.

## 2020-01-15 ENCOUNTER — Encounter: Payer: Self-pay | Admitting: Certified Nurse Midwife

## 2020-01-15 ENCOUNTER — Ambulatory Visit (INDEPENDENT_AMBULATORY_CARE_PROVIDER_SITE_OTHER): Payer: BC Managed Care – PPO | Admitting: Certified Nurse Midwife

## 2020-01-15 ENCOUNTER — Other Ambulatory Visit: Payer: Self-pay

## 2020-01-15 ENCOUNTER — Other Ambulatory Visit (HOSPITAL_COMMUNITY)
Admission: RE | Admit: 2020-01-15 | Discharge: 2020-01-15 | Disposition: A | Discharge status: Home / Self Care | Payer: BC Managed Care – PPO | Source: Ambulatory Visit | Attending: Certified Nurse Midwife | Admitting: Certified Nurse Midwife

## 2020-01-15 VITALS — BP 130/80 | HR 72 | Ht 69.0 in | Wt 147.0 lb

## 2020-01-15 DIAGNOSIS — Z124 Encounter for screening for malignant neoplasm of cervix: Secondary | ICD-10-CM

## 2020-01-15 DIAGNOSIS — Z1211 Encounter for screening for malignant neoplasm of colon: Secondary | ICD-10-CM | POA: Diagnosis not present

## 2020-01-15 DIAGNOSIS — M545 Low back pain, unspecified: Secondary | ICD-10-CM

## 2020-01-15 DIAGNOSIS — Z01419 Encounter for gynecological examination (general) (routine) without abnormal findings: Secondary | ICD-10-CM | POA: Diagnosis not present

## 2020-01-15 DIAGNOSIS — Z1239 Encounter for other screening for malignant neoplasm of breast: Secondary | ICD-10-CM

## 2020-01-15 LAB — POCT URINALYSIS DIPSTICK
Bilirubin, UA: NEGATIVE
Blood, UA: NEGATIVE
Glucose, UA: NEGATIVE
Ketones, UA: NEGATIVE
Nitrite, UA: NEGATIVE
Protein, UA: NEGATIVE
Spec Grav, UA: 1.01 (ref 1.010–1.025)
Urobilinogen, UA: NEGATIVE E.U./dL — AB
pH, UA: 7.5 (ref 5.0–8.0)

## 2020-01-15 NOTE — Patient Instructions (Signed)
Vitamin D3 1000-2000 IU daily

## 2020-01-15 NOTE — Progress Notes (Signed)
Gynecology Annual Exam  PCP: Maple Hudson., MD  Chief Complaint:  Chief Complaint  Patient presents with  . Gynecologic Exam    joint pain, muscle weakness    History of Present Illness:Lisa Robbins presents today for her NP annual exam. It has been longer than 3 years since she was last seen at [facility]. She is a 59 year old Caucasian/White female, G1 P1001, whose LMP was 11/05/2012. She is having no significant GYN problems.  Her menses are absent and she is postmenopausal.  She has had no spotting.   The patient's past medical history is notable for a history of hypothyroidism (Hashimoto's), asthma, reflux, and esphageal and urethral strictures.  Since her last annual GYN exam dated 01/15/2015, she complains of worsening shortness of breath, joint pain  and muscle weakness. 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  She is sexually active. She does not have vaginal dryness. In the past she used testosterone and progesterone cream to help prevent urethral strictures. Currently she is using  Plexus X factor   Her most recent pap smear was obtained 01/15/2015 and was NIL/ negative HRHPV.  Her most recent mammogram obtained on 07/29/2013 revealed no significant changes. There is no family history of breast cancer. Her maternal grandmother had either uterine or ovarian cancer. The patient does do monthly self breast exams.  She denies a recent screening colonoscopy and is eligible.  She has not had a bone density scan. The patient does not smoke.  The patient does not drink alcohol.  The patient does not use illegal drugs.  The patient exercises occasionally.  The patient does get adequate calcium in her diet.  She has had a recent lipid panel in 2020 which she states was normal   Review of Systems: Review of Systems  Constitutional: Negative for chills, fever and weight loss.  HENT:  Negative for congestion, sinus pain and sore throat.   Eyes: Negative for blurred vision and pain.  Respiratory: Positive for shortness of breath. Negative for hemoptysis and wheezing.   Cardiovascular: Positive for palpitations. Negative for chest pain and leg swelling.  Gastrointestinal: Negative for abdominal pain, blood in stool, diarrhea, heartburn, nausea and vomiting.  Genitourinary: Negative for dysuria, frequency, hematuria and urgency.  Musculoskeletal: Positive for joint pain. Negative for back pain and myalgias.       Positive for muscle weakness  Skin: Negative for itching and rash.  Neurological: Negative for dizziness, tingling and headaches.  Endo/Heme/Allergies: Negative for environmental allergies and polydipsia. Does not bruise/bleed easily.       Negative for hirsutism   Psychiatric/Behavioral: Negative for depression. The patient is not nervous/anxious and does not have insomnia.     Past Medical History:  Past Medical History:  Diagnosis Date  . Asthma   . Esophageal stricture   . GERD (gastroesophageal reflux disease)   . Muscle weakness   . Musculoskeletal pain of lower extremity   . Sleep disorder   . Thyroid disease    Hashimoto disease    Past Surgical History:  Past Surgical History:  Procedure Laterality Date  . DIAGNOSTIC LAPAROSCOPY  01/18/1997   LLQ PAIN, DYSMENORRHEA. AUB  . DILATION AND CURETTAGE OF UTERUS  01/18/1997   ABD PAIN  . TONSILLECTOMY    . UPPER GI ENDOSCOPY  05/11/91; 10/20/91; 10/28/91   WITH BALOON DILATION  . URETHRAL DILATION  2007  Family History:  Family History  Problem Relation Age of Onset  . Stroke Mother   . Hypertension Mother   . Chronic fatigue Mother        chr fatique immune dysfunction  . Diabetes Father   . Cancer Maternal Grandmother 69       ovarian or uterine - not sure which  . Cancer Maternal Grandfather        fam hx of leukemia  . Diabetes Paternal Grandmother   . Diabetes Paternal  Grandfather     Social History:  Social History   Socioeconomic History  . Marital status: Married    Spouse name: Chrissie Noa  . Number of children: 1  . Years of education: 29  . Highest education level: Not on file  Occupational History  . Occupation: Runner, broadcasting/film/video  Tobacco Use  . Smoking status: Never Smoker  . Smokeless tobacco: Never Used  Substance and Sexual Activity  . Alcohol use: No  . Drug use: No  . Sexual activity: Yes    Partners: Male    Birth control/protection: Post-menopausal  Other Topics Concern  . Not on file  Social History Narrative  . Not on file   Social Determinants of Health   Financial Resource Strain:   . Difficulty of Paying Living Expenses:   Food Insecurity:   . Worried About Programme researcher, broadcasting/film/video in the Last Year:   . Barista in the Last Year:   Transportation Needs:   . Freight forwarder (Medical):   Marland Kitchen Lack of Transportation (Non-Medical):   Physical Activity:   . Days of Exercise per Week:   . Minutes of Exercise per Session:   Stress:   . Feeling of Stress :   Social Connections:   . Frequency of Communication with Friends and Family:   . Frequency of Social Gatherings with Friends and Family:   . Attends Religious Services:   . Active Member of Clubs or Organizations:   . Attends Banker Meetings:   Marland Kitchen Marital Status:   Intimate Partner Violence:   . Fear of Current or Ex-Partner:   . Emotionally Abused:   Marland Kitchen Physically Abused:   . Sexually Abused:     Allergies:  Allergies  Allergen Reactions  . Dust Mite Extract     Medications: Prior to Admission medications   Medication Sig Start Date End Date Taking? Authorizing Provider  albuterol (VENTOLIN HFA) 108 (90 Base) MCG/ACT inhaler Inhale 2 puffs into the lungs every 6 (six) hours as needed for wheezing or shortness of breath. 05/19/19  Yes [place], Coralee North, NP  amitriptyline (ELAVIL) 25 MG tablet Take 5 tablets (125 mg total) by mouth daily. 12/12/19   Yes Maple Hudson., MD  esomeprazole (NEXIUM) 40 MG capsule TAKE 1 CAPSULE BY MOUTH EVERY DAY 12/12/19  Yes Maple Hudson., MD  Glucosamine HCl-MSM (MSM GLUCOSAMINE) [id number] MG CAPS Take by mouth.   Yes [provider]  Multiple Vitamin (MULTIVITAMIN) capsule Take 1 capsule by mouth daily.   Yes [provider]  Probiotic Product (PRO-BIOTIC BLEND PO) Take by mouth.   Yes [provider]  salmeterol (SEREVENT) 50 MCG/DOSE diskus inhaler Inhale 1 puff into the lungs 2 (two) times daily.   Yes [provider]  SEREVENT DISKUS 50 MCG/DOSE diskus inhaler USE ONE PUFF BY MOUTH EVERY 12 HOURS 08/29/19  Yes Lyndon Code, MD  thyroid [facility] THYROID) 60 MG tablet Take by mouth. 05/04/13  Yes [provider]         LORazepam (ATIVAN) 0.5 MG tablet Take 1 tablet (0.5 mg total) by mouth at bedtime. Patient not taking: Reported on 01/15/2020 12/16/19   Jerrol Banana., MD  mometasone [facility], 120 METERED DOSES,) 220 MCG/INH inhaler Inhale 2 puffs into the lungs 2 (two) times daily. Patient not taking: Reported on 01/15/2020 08/04/19   Kendell Bane, NP  Plexus X factor  Physical Exam Vitals: BP 130/80   Pulse 72   Ht 5\' 9"  (1.753 m)   Wt 147 lb (66.7 kg)   BMI 21.71 kg/m .  General: thin WF in NAD HEENT: normocephalic, anicteric Neck: no thyroid enlargement, no palpable nodules, no cervical lymphadenopathy  Pulmonary: No increased work of breathing, CTAB Cardiovascular: RRR, without murmur  Breast: Breast symmetrical, no tenderness, no palpable nodules or masses, no skin or nipple retraction present, no nipple discharge.  No axillary, infraclavicular or supraclavicular lymphadenopathy. Abdomen: Soft, non-tender, non-distended.  Umbilicus without lesions.  No hepatomegaly or masses palpable. No evidence of hernia. Genitourinary:  External: Normal external female genitalia.  Normal urethral meatus, normal  Bartholin's and Skene's  glands.    Vagina: Normal vaginal mucosa, no evidence of prolapse.    Cervix: Grossly normal in appearance, no bleeding, non-tender  Uterus: Anteverted, normal size, shape, and consistency, mobile, and non-tender  Adnexa: No adnexal masses, non-tender  Rectal: no masses. Hemoccult was negative.  Lymphatic: no evidence of inguinal lymphadenopathy Extremities: no edema, erythema, or tenderness Neurologic: Grossly intact Psychiatric: mood appropriate, affect full     Assessment: 59 y.o. G1P1001 routine gyn exam  Plan:    1) Breast cancer screening - recommend monthly self breast exams and annual screening mammograms Mammogram was ordered today. Marland Kitchen 2) Cervical cancer screening - Pap was done. ASCCP guidelines and rational discussed.  Patient opts for every 3-5 year screening interval  3) Colon cancer screening. Explained options for screening including colonoscopy, Cologuard and FIT testing. She would like to do FIT testing. FIT test was negative today  4) Routine healthcare maintenance including cholesterol and diabetes screening managed by PCP   5) Discussed calcium and vitamin D3 requirements and discussed the role of exercise in preventing osteoporosis. Encouraged patient to take 1000-2000 IU vitamin D3 daily  6) RTO in 1 year and prn  Dalia Heading, CNM

## 2020-01-17 LAB — URINE CULTURE

## 2020-01-22 LAB — CYTOLOGY - PAP
Comment: NEGATIVE
Diagnosis: NEGATIVE
High risk HPV: NEGATIVE

## 2020-01-25 ENCOUNTER — Encounter: Payer: Self-pay | Admitting: Certified Nurse Midwife

## 2020-01-25 LAB — HEMOCCULT GUIAC POC 1CARD (OFFICE): Fecal Occult Blood, POC: NEGATIVE

## 2020-01-26 ENCOUNTER — Encounter: Payer: Self-pay | Admitting: Certified Nurse Midwife

## 2020-06-14 ENCOUNTER — Other Ambulatory Visit: Payer: Self-pay | Admitting: Family Medicine

## 2020-06-14 DIAGNOSIS — G4700 Insomnia, unspecified: Secondary | ICD-10-CM

## 2020-06-14 NOTE — Telephone Encounter (Signed)
Requested medication (s) are due for refill today: Yes  Requested medication (s) are on the active medication list: Yes  Last refill:  12/16/19  Future visit scheduled: No  Notes to clinic:  Unable to refill per protocol, cannot delegate, diagnosis code needed    Requested Prescriptions  Pending Prescriptions Disp Refills   LORazepam (ATIVAN) 0.5 MG tablet [Pharmacy Med Name: LORAZEPAM 0.5 MG TABLET] 30 tablet     Sig: TAKE 1 TABLET BY MOUTH AT BEDTIME.      Not Delegated - Psychiatry:  Anxiolytics/Hypnotics Failed - 06/14/2020 11:23 AM      Failed - This refill cannot be delegated      Failed - Urine Drug Screen completed in last 360 days.      Failed - Valid encounter within last 6 months    Recent Outpatient Visits           6 months ago Fibrositis   [facility] Maple Hudson., MD   2 years ago UTI symptoms   [facility] Maple Hudson., MD   3 years ago Gastroesophageal reflux disease, esophagitis presence not specified   [facility] Maple Hudson., MD   4 years ago Gastroesophageal reflux disease, esophagitis presence not specified   [facility] Maple Hudson., MD

## 2020-06-18 ENCOUNTER — Other Ambulatory Visit: Payer: Self-pay | Admitting: Family Medicine

## 2020-06-18 DIAGNOSIS — K219 Gastro-esophageal reflux disease without esophagitis: Secondary | ICD-10-CM

## 2020-06-18 NOTE — Telephone Encounter (Signed)
Pharmacy requesting an alternative- routing back to [facility]

## 2020-06-20 ENCOUNTER — Other Ambulatory Visit: Payer: Self-pay | Admitting: Family Medicine

## 2020-06-20 DIAGNOSIS — K219 Gastro-esophageal reflux disease without esophagitis: Secondary | ICD-10-CM

## 2020-06-20 NOTE — Telephone Encounter (Signed)
Please advise medication change? 

## 2020-06-20 NOTE — Telephone Encounter (Signed)
Alternative requested

## 2020-08-05 ENCOUNTER — Other Ambulatory Visit: Payer: Self-pay | Admitting: Internal Medicine

## 2020-08-05 DIAGNOSIS — J4520 Mild intermittent asthma, uncomplicated: Secondary | ICD-10-CM

## 2020-09-16 ENCOUNTER — Other Ambulatory Visit: Payer: Self-pay | Admitting: Family Medicine

## 2020-09-16 DIAGNOSIS — K219 Gastro-esophageal reflux disease without esophagitis: Secondary | ICD-10-CM

## 2020-09-16 NOTE — Telephone Encounter (Signed)
Requested Prescriptions  Pending Prescriptions Disp Refills  . pantoprazole (PROTONIX) 40 MG tablet [Pharmacy Med Name: PANTOPRAZOLE SOD DR 40 MG TAB] 90 tablet     Sig: TAKE 1 TABLET BY MOUTH EVERY DAY AS NEEDED     Gastroenterology: Proton Pump Inhibitors Passed - 09/16/2020  7:56 AM      Passed - Valid encounter within last 12 months    Recent Outpatient Visits          9 months ago Fibrositis   [facility] Maple Hudson., MD   2 years ago UTI symptoms   [facility] Maple Hudson., MD   3 years ago Gastroesophageal reflux disease, esophagitis presence not specified   [facility] Maple Hudson., MD   5 years ago Gastroesophageal reflux disease, esophagitis presence not specified   [facility] Maple Hudson., MD

## 2020-11-21 DIAGNOSIS — E063 Autoimmune thyroiditis: Secondary | ICD-10-CM | POA: Diagnosis not present

## 2020-11-21 DIAGNOSIS — E559 Vitamin D deficiency, unspecified: Secondary | ICD-10-CM | POA: Diagnosis not present

## 2020-11-21 DIAGNOSIS — E042 Nontoxic multinodular goiter: Secondary | ICD-10-CM | POA: Diagnosis not present

## 2020-11-21 DIAGNOSIS — E039 Hypothyroidism, unspecified: Secondary | ICD-10-CM | POA: Diagnosis not present

## 2020-11-30 DIAGNOSIS — E042 Nontoxic multinodular goiter: Secondary | ICD-10-CM | POA: Diagnosis not present

## 2020-11-30 DIAGNOSIS — Z7189 Other specified counseling: Secondary | ICD-10-CM | POA: Diagnosis not present

## 2020-11-30 DIAGNOSIS — E063 Autoimmune thyroiditis: Secondary | ICD-10-CM | POA: Diagnosis not present

## 2020-11-30 DIAGNOSIS — E039 Hypothyroidism, unspecified: Secondary | ICD-10-CM | POA: Diagnosis not present

## 2020-12-12 ENCOUNTER — Other Ambulatory Visit: Payer: Self-pay | Admitting: Family Medicine

## 2020-12-12 DIAGNOSIS — K219 Gastro-esophageal reflux disease without esophagitis: Secondary | ICD-10-CM

## 2020-12-26 ENCOUNTER — Other Ambulatory Visit: Payer: Self-pay | Admitting: Family Medicine

## 2020-12-26 DIAGNOSIS — K219 Gastro-esophageal reflux disease without esophagitis: Secondary | ICD-10-CM

## 2021-01-05 ENCOUNTER — Encounter: Payer: Self-pay | Admitting: Family Medicine

## 2021-01-05 ENCOUNTER — Ambulatory Visit: Payer: BC Managed Care – PPO | Admitting: Family Medicine

## 2021-01-05 ENCOUNTER — Other Ambulatory Visit: Payer: Self-pay

## 2021-01-05 VITALS — BP 133/84 | HR 85 | Temp 98.5°F | Resp 16 | Ht 69.0 in | Wt 155.0 lb

## 2021-01-05 DIAGNOSIS — M797 Fibromyalgia: Secondary | ICD-10-CM

## 2021-01-05 DIAGNOSIS — G4700 Insomnia, unspecified: Secondary | ICD-10-CM

## 2021-01-05 DIAGNOSIS — J4530 Mild persistent asthma, uncomplicated: Secondary | ICD-10-CM | POA: Diagnosis not present

## 2021-01-05 DIAGNOSIS — K2100 Gastro-esophageal reflux disease with esophagitis, without bleeding: Secondary | ICD-10-CM | POA: Diagnosis not present

## 2021-01-05 DIAGNOSIS — M545 Low back pain, unspecified: Secondary | ICD-10-CM

## 2021-01-05 DIAGNOSIS — G8929 Other chronic pain: Secondary | ICD-10-CM

## 2021-01-05 NOTE — Progress Notes (Signed)
I,April Miller,acting as a scribe for Megan Mans, MD.,have documented all relevant documentation on the behalf of Megan Mans, MD,as directed by  Megan Mans, MD while in the presence of Megan Mans, MD.    Established patient visit   Patient: Lisa Robbins   DOB: 1961-10-07   60 y.o. Female  MRN: [id number] Visit Date: 01/05/2021  Today's healthcare provider: Megan Mans, MD   Chief Complaint  Patient presents with  . Follow-up  . Insomnia   Subjective    HPI  Patient has multiple issues to discuss.  She has reflux and epigastric pain that is not relieved by Protonix which is covered.  She states she does well with the high-dose esomeprazole. She has pain in the knees and the right hip and the right lower back.  Says she has joint and muscle pain on the right.  She sees Dr. Park Breed from pulmonary for her asthma, Dr Patrecia Pace for her thyroid, Does her well woman exam. Follow up for insomnia  The patient was last seen for this 1 years ago. Changes made at last visit include no medication changes.  She reports good compliance with treatment. She feels that condition is Unchanged. She is not having side effects. Patient requesting refills on lorazepam.    Follow up for fibrositis  The patient was last seen for this 1 years ago. Changes made at last visit include no medication changes.  She reports good compliance with treatment. She feels that condition is Improved. She is not having side effects. Patient requesting refills on amitriptyline.   --------------------------------------------------------------------       Medications: Outpatient Medications Prior to Visit  Medication Sig  . amitriptyline (ELAVIL) 25 MG tablet Take 5 tablets (125 mg total) by mouth daily.  . Esomeprazole Magnesium (NEXIUM PO) Take by mouth.  . Glucosamine HCl-MSM (MSM GLUCOSAMINE) 375-375 MG CAPS Take by mouth.  Marland Kitchen LORazepam (ATIVAN) 0.5 MG tablet TAKE  1 TABLET BY MOUTH AT BEDTIME.  . Multiple Vitamin (MULTIVITAMIN) capsule Take 1 capsule by mouth daily.  . Probiotic Product (PRO-BIOTIC BLEND PO) Take by mouth.  . salmeterol (SEREVENT) 50 MCG/DOSE diskus inhaler Inhale 1 puff into the lungs 2 (two) times daily.  . SEREVENT DISKUS 50 MCG/DOSE diskus inhaler USE ONE PUFF BY MOUTH EVERY 12 HOURS  . thyroid (ARMOUR) 60 MG tablet Take by mouth.  Marland Kitchen albuterol (VENTOLIN HFA) 108 (90 Base) MCG/ACT inhaler Inhale 2 puffs into the lungs every 6 (six) hours as needed for wheezing or shortness of breath. (Patient not taking: Reported on 01/05/2021)  . Cyanocobalamin 1000 MCG CAPS Take by mouth. (Patient not taking: Reported on 01/05/2021)  . mometasone (ASMANEX, 120 METERED DOSES,) 220 MCG/INH inhaler Inhale 2 puffs into the lungs 2 (two) times daily. (Patient not taking: No sig reported)  . pantoprazole (PROTONIX) 40 MG tablet TAKE 1 TABLET BY MOUTH EVERY DAY AS NEEDED (Patient not taking: Reported on 01/05/2021)   No facility-administered medications prior to visit.    Review of Systems  Constitutional: Negative for appetite change, chills, fatigue and fever.  Respiratory: Negative for chest tightness and shortness of breath.   Cardiovascular: Negative for chest pain and palpitations.  Gastrointestinal: Negative for abdominal pain, nausea and vomiting.  Neurological: Negative for dizziness and weakness.        Objective    BP 133/84 (BP Location: Left Arm, Patient Position: Sitting, Cuff Size: Normal)   Pulse 85   Temp 98.5 F (36.9  C) (Oral)   Resp 16   Ht 5\' 9"  (1.753 m)   Wt 155 lb (70.3 kg)   SpO2 99%   BMI 22.89 kg/m  Wt Readings from Last 3 Encounters:  01/05/21 155 lb (70.3 kg)  01/15/20 147 lb (66.7 kg)  12/08/19 148 lb (67.1 kg)       Physical Exam Vitals reviewed.  Constitutional:      Appearance: Normal appearance.  HENT:     Head: Normocephalic and atraumatic.     Right Ear: External ear normal.     Left Ear: External  ear normal.  Eyes:     General: No scleral icterus.    Conjunctiva/sclera: Conjunctivae normal.  Cardiovascular:     Rate and Rhythm: Normal rate and regular rhythm.     Heart sounds: Normal heart sounds.  Pulmonary:     Breath sounds: Normal breath sounds.  Abdominal:     Palpations: Abdomen is soft.     Comments: Minnimal epigastric tenderness.  Skin:    General: Skin is warm and dry.  Neurological:     General: No focal deficit present.     Mental Status: She is alert and oriented to person, place, and time.  Psychiatric:        Mood and Affect: Mood normal.        Behavior: Behavior normal.        Thought Content: Thought content normal.        Judgment: Judgment normal.       No results found for any visits on 01/05/21.  Assessment & Plan     1. Gastroesophageal reflux disease with esophagitis, unspecified whether hemorrhage Return to clinic 3 months. After discussion with patient we will stop pantoprazole and try 40 mg every morning. - esomeprazole (NEXIUM) 20 MG capsule; Take 1 capsule (20 mg total) by mouth daily at 12 noon.  Dispense: 30 capsule; Refill: 5  2. Insomnia, unspecified type States a bottle of 30 last or more than a year. - LORazepam (ATIVAN) 0.5 MG tablet; Take 1 tablet (0.5 mg total) by mouth at bedtime.  Dispense: 30 tablet; Refill: 0  3. Fibrositis Continue Elavil indefinitely for the time being.  I think a lower dose will be helpful going forward. - amitriptyline (ELAVIL) 25 MG tablet; Take 5 tablets (125 mg total) by mouth daily.  Dispense: 90 tablet; Refill: 3  4. Mild persistent asthma without complication Per pulmonary  5. Chronic right-sided low back pain without sciatica Helped.By Elavil. Consider gabapentin.   No follow-ups on file.      I, 01/07/21, MD, have reviewed all documentation for this visit. The documentation on 01/08/21 for the exam, diagnosis, procedures, and orders are all accurate and  complete.    Esco Joslyn 01/10/21, MD  [facility] [phone] (phone) [phone] (fax)  [facility]

## 2021-01-05 NOTE — Patient Instructions (Signed)
Try over-the-counter Turmeric daily. 

## 2021-01-08 MED ORDER — LORAZEPAM 0.5 MG PO TABS: 0.5000 mg | ORAL_TABLET | Freq: Every day | ORAL | 0 refills | Status: DC

## 2021-01-08 MED ORDER — ESOMEPRAZOLE MAGNESIUM 20 MG PO CPDR: 20.0000 mg | DELAYED_RELEASE_CAPSULE | Freq: Every day | ORAL | 5 refills | Status: DC

## 2021-01-08 MED ORDER — AMITRIPTYLINE HCL 25 MG PO TABS: 125.0000 mg | ORAL_TABLET | Freq: Every day | ORAL | 3 refills | Status: DC

## 2021-02-15 ENCOUNTER — Other Ambulatory Visit: Payer: Self-pay | Admitting: Internal Medicine

## 2021-02-15 DIAGNOSIS — J4520 Mild intermittent asthma, uncomplicated: Secondary | ICD-10-CM

## 2021-05-21 ENCOUNTER — Other Ambulatory Visit: Payer: Self-pay | Admitting: Family Medicine

## 2021-05-21 DIAGNOSIS — M797 Fibromyalgia: Secondary | ICD-10-CM

## 2021-05-21 NOTE — Telephone Encounter (Signed)
last RF 01/08/21 #90 3 RF

## 2021-05-25 ENCOUNTER — Other Ambulatory Visit: Payer: Self-pay | Admitting: Family Medicine

## 2021-05-25 DIAGNOSIS — M797 Fibromyalgia: Secondary | ICD-10-CM

## 2021-05-25 NOTE — Telephone Encounter (Signed)
   Notes to clinic:   DX Code Needed    Requested Prescriptions  Pending Prescriptions Disp Refills   amitriptyline (ELAVIL) 25 MG tablet [Pharmacy Med Name: AMITRIPTYLINE HCL 25 MG TAB] 90 tablet 3    Sig: TAKE 5 TABLETS (125 MG TOTAL) BY MOUTH DAILY.      Psychiatry:  Antidepressants - Heterocyclics (TCAs) Passed - 05/25/2021  3:03 PM      Passed - Valid encounter within last 6 months    Recent Outpatient Visits           4 months ago Gastroesophageal reflux disease with esophagitis, unspecified whether hemorrhage   [facility] Maple Hudson., MD   1 year ago Fibrositis   [facility] Maple Hudson., MD   3 years ago UTI symptoms   [facility] Maple Hudson., MD   4 years ago Gastroesophageal reflux disease, esophagitis presence not specified   [facility] Maple Hudson., MD   5 years ago Gastroesophageal reflux disease, esophagitis presence not specified   [facility] Maple Hudson., MD

## 2021-05-31 ENCOUNTER — Telehealth: Payer: Self-pay

## 2021-05-31 NOTE — Telephone Encounter (Signed)
Copied from CRM [id number]. Topic: General - Other >> May 31, 2021 10:30 AM Gaetana Michaelis A wrote: Reason for CRM: Patient would like to be contacted about their prescription for amitriptyline (ELAVIL) 25 MG tablet   The patient has been in contact with their pharmacy and been directed to contact their primary care provider   Patient is uncertain of the reason for the delay in their prescription and would like to discuss further

## 2021-05-31 NOTE — Telephone Encounter (Signed)
Patient is requesting a refill on amitriptyline. Its in your refill request box for review.

## 2021-08-28 ENCOUNTER — Other Ambulatory Visit: Payer: Self-pay | Admitting: Family Medicine

## 2021-08-28 DIAGNOSIS — M797 Fibromyalgia: Secondary | ICD-10-CM

## 2021-08-28 NOTE — Telephone Encounter (Signed)
Medication: amitriptyline (ELAVIL) 25 MG tablet [[id number]]   Has the patient contacted their pharmacy? NO - Pt states she waited so long last time. The office did not respond to the pharmacy. (Agent: If no, request that the patient contact the pharmacy for the refill.) (Agent: If yes, when and what did the pharmacy advise?)   Preferred Pharmacy (with phone number or street name): [id number] [place], [place] - [address] [place] [address] Phone: [phone] Fax: [phone] Hours: Not open 24 hours  Has the patient been seen for an appointment in the last year OR does the patient have an upcoming appointment? YES 01/05/21  Agent: Please be advised that RX refills may take up to 3 business days. We ask that you follow-up with your pharmacy.

## 2021-08-28 NOTE — Addendum Note (Signed)
Addended by: Osie Bond on: 08/28/2021 04:59 PM   Modules accepted: Orders

## 2021-08-29 MED ORDER — AMITRIPTYLINE HCL 25 MG PO TABS: 125.0000 mg | ORAL_TABLET | Freq: Every day | ORAL | 0 refills | Status: DC

## 2021-08-29 NOTE — Telephone Encounter (Signed)
Requested medications are due for refill today yes, ordered to take 5 a day but rx only for 90 with 3 refills. Would need 150 month.  Requested medications are on the active medication list yes  Last visit 01/05/21, asked to return in 3 months  Future visit scheduled no  Notes to clinic Dx code needed, given 90 with 3 refills, however, 5 pills a day would be 150 pills month, asked to return in 3 months and did not, no upcoming appt scheduled, failed protocol.  Requested Prescriptions  Pending Prescriptions Disp Refills   amitriptyline (ELAVIL) 25 MG tablet 90 tablet 3    Sig: Take 5 tablets (125 mg total) by mouth daily.     Psychiatry:  Antidepressants - Heterocyclics (TCAs) Failed - 08/28/2021  4:59 PM      Failed - Valid encounter within last 6 months    Recent Outpatient Visits           7 months ago Gastroesophageal reflux disease with esophagitis, unspecified whether hemorrhage   [facility] Maple Hudson., MD   1 year ago Fibrositis   [facility] Maple Hudson., MD   3 years ago UTI symptoms   [facility] Maple Hudson., MD   4 years ago Gastroesophageal reflux disease, esophagitis presence not specified   [facility] Maple Hudson., MD   5 years ago Gastroesophageal reflux disease, esophagitis presence not specified   [facility] Maple Hudson., MD

## 2021-09-26 ENCOUNTER — Encounter: Payer: Self-pay | Admitting: Internal Medicine

## 2021-09-26 ENCOUNTER — Ambulatory Visit: Payer: BC Managed Care – PPO | Admitting: Internal Medicine

## 2021-09-26 ENCOUNTER — Other Ambulatory Visit: Payer: Self-pay

## 2021-09-26 ENCOUNTER — Encounter (INDEPENDENT_AMBULATORY_CARE_PROVIDER_SITE_OTHER): Payer: Self-pay

## 2021-09-26 VITALS — BP 126/76 | HR 105 | Temp 98.3°F | Resp 16 | Ht 69.0 in | Wt 156.8 lb

## 2021-09-26 DIAGNOSIS — R000 Tachycardia, unspecified: Secondary | ICD-10-CM | POA: Diagnosis not present

## 2021-09-26 DIAGNOSIS — R0602 Shortness of breath: Secondary | ICD-10-CM

## 2021-09-26 DIAGNOSIS — J301 Allergic rhinitis due to pollen: Secondary | ICD-10-CM | POA: Diagnosis not present

## 2021-09-26 DIAGNOSIS — J4520 Mild intermittent asthma, uncomplicated: Secondary | ICD-10-CM

## 2021-09-26 MED ORDER — ALBUTEROL SULFATE HFA 108 (90 BASE) MCG/ACT IN AERS
2.0000 | INHALATION_SPRAY | Freq: Four times a day (QID) | RESPIRATORY_TRACT | 0 refills | Status: DC | PRN
Start: 2021-09-26 — End: 2021-10-25

## 2021-09-26 MED ORDER — FLUTICASONE-SALMETEROL 250-50 MCG/ACT IN AEPB: 1.0000 | INHALATION_SPRAY | Freq: Two times a day (BID) | RESPIRATORY_TRACT | 4 refills | Status: DC

## 2021-09-26 NOTE — Progress Notes (Signed)
[facility] [address] [place], [place] [address]  Pulmonary Sleep Medicine   Office Visit Note  Patient Name: Lisa Robbins DOB: 1960/11/24 MRN [id number]  Date of Service: 09/26/2021  Complaints/HPI: follow up of asthma. Patient states that she has no admissions to the hospital. Overall doing well/ States that she has not had any flareups. Patient states that she has been using inhalers as prescribed. Patient has been on serevent. Rescue inhaler has been with albuterol. Patient states she has not been on combined treatment  ROS  General: (-) fever, (-) chills, (-) night sweats, (-) weakness Skin: (-) rashes, (-) itching,. Eyes: (-) visual changes, (-) redness, (-) itching. Nose and Sinuses: (-) nasal stuffiness or itchiness, (-) postnasal drip, (-) nosebleeds, (-) sinus trouble. Mouth and Throat: (-) sore throat, (-) hoarseness. Neck: (-) swollen glands, (-) enlarged thyroid, (-) neck pain. Respiratory: - cough, (-) bloody sputum, - shortness of breath, - wheezing. Cardiovascular: - ankle swelling, (-) chest pain. Lymphatic: (-) lymph node enlargement. Neurologic: (-) numbness, (-) tingling. Psychiatric: (-) anxiety, (-) depression   Current Medication: Outpatient Encounter Medications as of 09/26/2021  Medication Sig Note   albuterol (VENTOLIN HFA) 108 (90 Base) MCG/ACT inhaler Inhale 2 puffs into the lungs every 6 (six) hours as needed for wheezing or shortness of breath.    amitriptyline (ELAVIL) 25 MG tablet Take 5 tablets (125 mg total) by mouth daily.    esomeprazole (NEXIUM) 20 MG capsule Take 1 capsule (20 mg total) by mouth daily at 12 noon.    Glucosamine HCl-MSM (MSM GLUCOSAMINE) 375-375 MG CAPS Take by mouth. 09/02/2015: Received from: [facility]   LORazepam (ATIVAN) 0.5 MG tablet Take 1 tablet (0.5 mg total) by mouth at bedtime.    Multiple Vitamin (MULTIVITAMIN) capsule Take 1 capsule by mouth daily.    Probiotic Product  (PRO-BIOTIC BLEND PO) Take by mouth.    salmeterol (SEREVENT) 50 MCG/DOSE diskus inhaler Inhale 1 puff into the lungs 2 (two) times daily.    thyroid (ARMOUR) 60 MG tablet Take by mouth. 09/02/2015: Received from: [facility]   Turmeric (QC TUMERIC COMPLEX PO) Take by mouth. For inflammation    [DISCONTINUED] Cyanocobalamin 1000 MCG CAPS Take by mouth. (Patient not taking: No sig reported) 09/02/2015: Received from: [facility]   [DISCONTINUED] mometasone (ASMANEX, 120 METERED DOSES,) 220 MCG/INH inhaler Inhale 2 puffs into the lungs 2 (two) times daily. (Patient not taking: No sig reported)    [DISCONTINUED] pantoprazole (PROTONIX) 40 MG tablet TAKE 1 TABLET BY MOUTH EVERY DAY AS NEEDED (Patient not taking: No sig reported)    [DISCONTINUED] SEREVENT DISKUS 50 MCG/DOSE diskus inhaler USE ONE PUFF BY MOUTH EVERY 12 HOURS (Patient not taking: Reported on 09/26/2021)    No facility-administered encounter medications on file as of 09/26/2021.    Surgical History: Past Surgical History:  Procedure Laterality Date   DIAGNOSTIC LAPAROSCOPY  01/18/1997   LLQ PAIN, DYSMENORRHEA. AUB   DILATION AND CURETTAGE OF UTERUS  01/18/1997   ABD PAIN   TONSILLECTOMY     UPPER GI ENDOSCOPY  05/11/91; 10/20/91; 10/28/91   WITH BALOON DILATION   URETHRAL DILATION  2007    Medical History: Past Medical History:  Diagnosis Date   Asthma    Esophageal stricture    GERD (gastroesophageal reflux disease)    Muscle weakness    Musculoskeletal pain of lower extremity    Sleep disorder    Thyroid disease    Hashimoto disease  Family History: Family History  Problem Relation Age of Onset   Stroke Mother    Hypertension Mother    Chronic fatigue Mother        chr fatique immune dysfunction   Diabetes Father    Cancer Maternal Grandmother 36       ovarian or uterine - not sure which   Cancer Maternal Grandfather        fam hx of leukemia   Diabetes Paternal  Grandmother    Diabetes Paternal Grandfather     Social History: Social History   Socioeconomic History   Marital status: Married    Spouse name: Chrissie Noa   Number of children: 1   Years of education: 16   Highest education level: Not on file  Occupational History   Occupation: Runner, broadcasting/film/video  Tobacco Use   Smoking status: Never   Smokeless tobacco: Never  Vaping Use   Vaping Use: Never used  Substance and Sexual Activity   Alcohol use: No   Drug use: No   Sexual activity: Yes    Partners: Male    Birth control/protection: Post-menopausal  Other Topics Concern   Not on file  Social History Narrative   Not on file   Social Determinants of Health   Financial Resource Strain: Not on file  Food Insecurity: Not on file  Transportation Needs: Not on file  Physical Activity: Not on file  Stress: Not on file  Social Connections: Not on file  Intimate Partner Violence: Not on file    Vital Signs: Blood pressure 126/76, pulse (!) 105, temperature 98.3 F (36.8 C), resp. rate 16, height 5\' 9"  (1.753 m), weight 156 lb 12.8 oz (71.1 kg), SpO2 99 %.  Examination: General Appearance: The patient is well-developed, well-nourished, and in no distress. Skin: Gross inspection of skin unremarkable. Head: normocephalic, no gross deformities. Eyes: no gross deformities noted. ENT: ears appear grossly normal no exudates. Neck: Supple. No thyromegaly. No LAD. Respiratory: no rhonchi noted. Cardiovascular: Normal S1 and S2 without murmur or rub. Extremities: No cyanosis. pulses are equal. Neurologic: Alert and oriented. No involuntary movements.  LABS: No results found for this or any previous visit (from the past 2160 hour(s)).  Radiology: No results found.  No results found.  No results found.    Assessment and Plan: Patient Active Problem List   Diagnosis Date Noted   Allergic rhinitis 09/02/2015   Airway hyperreactivity 09/02/2015   Abnormal antinuclear antibody titer  09/02/2015   Fibrositis 09/02/2015   Generalized anxiety disorder 09/02/2015   Acid reflux 09/02/2015   History of prolonged Q-T interval on ECG 09/02/2015   Headache, migraine 09/02/2015   H/O arthritis 09/02/2015   Cannot sleep 09/02/2015   Climacteric 09/02/2015   Disorder of thyroid 09/02/2015   Chronic venous insufficiency 09/02/2015    1. SOB (shortness of breath) She continues to have some chest tightness. She has had a cardiac evaluation done many years ago. Patient may need to have this looked at again. Would defer to PCP - Spirometry with Graph  2. Chronic asthma, mild intermittent, uncomplicated  - fluticasone-salmeterol (ADVAIR DISKUS) 250-50 MCG/ACT AEPB; Inhale 1 puff into the lungs in the morning and at bedtime.  Dispense: 1 each; Refill: 4 - albuterol (VENTOLIN HFA) 108 (90 Base) MCG/ACT inhaler; Inhale 2 puffs into the lungs every 6 (six) hours as needed for wheezing or shortness of breath.  Dispense: 8 g; Refill: 0  3. Seasonal allergic rhinitis due to pollen Hisotry of allergies has had  evalaution done but did not want shots  4. Tachycardia Would get an ECG and also would also get an Echo. She did have a gallop faint     General Counseling: I have discussed the findings of the evaluation and examination with Dicy.  I have also discussed any further diagnostic evaluation thatmay be needed or ordered today. Seena verbalizes understanding of the findings of todays visit. We also reviewed her medications today and discussed drug interactions and side effects including but not limited excessive drowsiness and altered mental states. We also discussed that there is always a risk not just to her but also people around her. she has been encouraged to call the office with any questions or concerns that should arise related to todays visit.  Orders Placed This Encounter  Procedures   Spirometry with Graph    Order Specific Question:   Where should this test be performed?     Answer:   [facility]    Order Specific Question:   Basic spirometry    Answer:   Yes    Order Specific Question:   Spirometry pre & post bronchodilator    Answer:   No     Time spent: 70  I have personally obtained a history, examined the patient, evaluated laboratory and imaging results, formulated the assessment and plan and placed orders.    Yevonne Pax, MD [facility] Pulmonary and Critical Care Sleep medicine

## 2021-09-26 NOTE — Patient Instructions (Signed)

## 2021-10-02 ENCOUNTER — Other Ambulatory Visit: Payer: Self-pay | Admitting: Family Medicine

## 2021-10-02 DIAGNOSIS — M797 Fibromyalgia: Secondary | ICD-10-CM

## 2021-10-02 NOTE — Telephone Encounter (Signed)
Medication Refill - Medication: amitriptyline (ELAVIL) 25 MG tablet  Has the patient contacted their pharmacy? Yes.    (Agent: If yes, when and what did the pharmacy advise?) Patient will run out prior to follow up appointment therefore requesting a short supply  Preferred Pharmacy (with phone number or street name):  [id number] [place], [place] - [address] Phone:  [phone]  Fax:  [phone]       Has the patient been seen for an appointment in the last year OR does the patient have an upcoming appointment? Yes.    Agent: Please be advised that RX refills may take up to 3 business days. We ask that you follow-up with your pharmacy.

## 2021-10-02 NOTE — Telephone Encounter (Signed)
Requested medications are due for refill today.  yes  Requested medications are on the active medications list.  yes  Last refill. 08/29/2021  Future visit scheduled.   yes  Notes to clinic.  Pharmacy needs DX code

## 2021-10-03 MED ORDER — AMITRIPTYLINE HCL 25 MG PO TABS: 125.0000 mg | ORAL_TABLET | Freq: Every day | ORAL | 0 refills | Status: DC

## 2021-10-11 ENCOUNTER — Ambulatory Visit: Payer: BC Managed Care – PPO

## 2021-10-11 ENCOUNTER — Other Ambulatory Visit: Payer: Self-pay

## 2021-10-11 DIAGNOSIS — R0602 Shortness of breath: Secondary | ICD-10-CM | POA: Diagnosis not present

## 2021-10-20 NOTE — Progress Notes (Signed)
Established patient visit   Patient: Lisa Robbins   DOB: Jun 06, 1961   60 y.o. Female  MRN: [id number] Visit Date: 10/23/2021  Today's healthcare provider: Jacky Kindle, FNP   No chief complaint on file.  Subjective    HPI  -Would like to discuss medications in order to receive refills. -Wants to receive mammogram after new years -Says she has been stressed with asthma  -Patient reports Advair makes her feel worse like her throat is closing so she has not been using it and will discuss with her pulmonologist.    Medications: Outpatient Medications Prior to Visit  Medication Sig   albuterol (VENTOLIN HFA) 108 (90 Base) MCG/ACT inhaler Inhale 2 puffs into the lungs every 6 (six) hours as needed for wheezing or shortness of breath.   esomeprazole (NEXIUM) 20 MG capsule Take 1 capsule (20 mg total) by mouth daily at 12 noon.   Glucosamine HCl-MSM (MSM GLUCOSAMINE) 375-375 MG CAPS Take by mouth.   Multiple Vitamin (MULTIVITAMIN) capsule Take 1 capsule by mouth daily.   Probiotic Product (PRO-BIOTIC BLEND PO) Take by mouth.   thyroid (ARMOUR) 60 MG tablet Take by mouth.   Turmeric (QC TUMERIC COMPLEX PO) Take by mouth. For inflammation   [DISCONTINUED] amitriptyline (ELAVIL) 25 MG tablet Take 5 tablets (125 mg total) by mouth daily.   [DISCONTINUED] LORazepam (ATIVAN) 0.5 MG tablet Take 1 tablet (0.5 mg total) by mouth at bedtime.   fluticasone-salmeterol (ADVAIR DISKUS) 250-50 MCG/ACT AEPB Inhale 1 puff into the lungs in the morning and at bedtime. (Patient not taking: Reported on 10/23/2021)   No facility-administered medications prior to visit.    Review of Systems     Objective    BP (!) 146/81 (BP Location: Right Arm, Patient Position: Sitting, Cuff Size: Normal)   Pulse 84   Temp 98.7 F (37.1 C) (Oral)   Ht 5\' 9"  (1.753 m)   Wt 156 lb 3.2 oz (70.9 kg)   SpO2 100%   BMI 23.07 kg/m  BP Readings from Last 3 Encounters:  10/23/21 (!) 146/81  09/26/21 126/76   01/05/21 133/84   Wt Readings from Last 3 Encounters:  10/23/21 156 lb 3.2 oz (70.9 kg)  09/26/21 156 lb 12.8 oz (71.1 kg)  01/05/21 155 lb (70.3 kg)      Physical Exam Vitals and nursing note reviewed.  Constitutional:      General: She is not in acute distress.    Appearance: Normal appearance. She is normal weight. She is not ill-appearing, toxic-appearing or diaphoretic.  HENT:     Head: Normocephalic and atraumatic.  Cardiovascular:     Rate and Rhythm: Normal rate and regular rhythm.     Pulses: Normal pulses.     Heart sounds: Normal heart sounds. No murmur heard.   No friction rub. No gallop.  Pulmonary:     Effort: Pulmonary effort is normal. No respiratory distress.     Breath sounds: Normal breath sounds. No stridor. No wheezing, rhonchi or rales.  Chest:     Chest wall: No tenderness.  Abdominal:     General: Bowel sounds are normal.     Palpations: Abdomen is soft.  Musculoskeletal:        General: No swelling, tenderness, deformity or signs of injury. Normal range of motion.     Cervical back: Normal range of motion and neck supple. No rigidity or tenderness.     Right lower leg: No edema.  Left lower leg: No edema.  Lymphadenopathy:     Cervical: No cervical adenopathy.  Skin:    General: Skin is warm and dry.     Capillary Refill: Capillary refill takes less than 2 seconds.     Coloration: Skin is not jaundiced or pale.     Findings: No bruising, erythema, lesion or rash.  Neurological:     General: No focal deficit present.     Mental Status: She is alert and oriented to person, place, and time. Mental status is at baseline.     Cranial Nerves: No cranial nerve deficit.     Sensory: No sensory deficit.     Motor: No weakness.     Coordination: Coordination normal.  Psychiatric:        Mood and Affect: Mood normal.        Behavior: Behavior normal.        Thought Content: Thought content normal.        Judgment: Judgment normal.     No  results found for any visits on 10/23/21.  Assessment & Plan     Problem List Items Addressed This Visit       Cardiovascular and Mediastinum   Primary hypertension    Recommend starting medication Pt noted that HR was elevated at pulm visit- normal today Continue to monitor sodium intake and stress response         Respiratory   Airway hyperreactivity    Was started on new treatment; felt that it exacerbated her condition Pt concerned about daily use of medication with steroids Plans to discuss with pulm- did not want changes made to POC today         Endocrine   Disorder of thyroid    F/b endocrine Neck soft, supple      Hashimoto's disease    Has f/u soon- did not want lab work done at this time      Multiple thyroid nodules    Continue to monitor nodules- in endocrine setting Pt denies concerns with thyroid r/t s/s        Musculoskeletal and Integument   Fibrositis - Primary    Stable with daily qHS medication      Relevant Medications   amitriptyline (ELAVIL) 25 MG tablet     Other   Cannot sleep    Prn xanax to assist with anxiety if unable to get to sleep outside of routine medication use; may use 1-2 times/week      Relevant Medications   LORazepam (ATIVAN) 0.5 MG tablet   Arthralgia of both knees    Recommend use of exercises to strengthen muscles surrounding knee Did not want referral to ortho at this time      Relevant Orders   Comprehensive metabolic panel   Avitaminosis D    Known concern; repeat lab work Remains on daily supplement      Relevant Orders   Vitamin D (25 hydroxy)   Encounter for hepatitis C screening test for low risk patient    Low risk screening      Relevant Orders   Hepatitis C Antibody   Encounter for screening for HIV    Low risk screening      Relevant Orders   HIV antibody (with reflex)   Screening for osteoporosis    Given OA and long term use of steroids      Relevant Orders   DG Bone Density    Encounter for screening mammogram for malignant neoplasm  of breast    No concerns, screening      Relevant Orders   MM 3D SCREEN BREAST BILATERAL     Return if symptoms worsen or fail to improve.      Leilani Merl, FNP, have reviewed all documentation for this visit. The documentation on 10/23/21 for the exam, diagnosis, procedures, and orders are all accurate and complete.    Jacky Kindle, FNP  [facility] [phone] (phone) [phone] (fax)  [facility]

## 2021-10-23 ENCOUNTER — Other Ambulatory Visit: Payer: Self-pay

## 2021-10-23 ENCOUNTER — Encounter: Payer: Self-pay | Admitting: Family Medicine

## 2021-10-23 ENCOUNTER — Ambulatory Visit: Payer: BC Managed Care – PPO | Admitting: Family Medicine

## 2021-10-23 VITALS — BP 146/81 | HR 84 | Temp 98.7°F | Ht 69.0 in | Wt 156.2 lb

## 2021-10-23 DIAGNOSIS — M25561 Pain in right knee: Secondary | ICD-10-CM | POA: Insufficient documentation

## 2021-10-23 DIAGNOSIS — M25562 Pain in left knee: Secondary | ICD-10-CM | POA: Diagnosis not present

## 2021-10-23 DIAGNOSIS — Z1231 Encounter for screening mammogram for malignant neoplasm of breast: Secondary | ICD-10-CM | POA: Insufficient documentation

## 2021-10-23 DIAGNOSIS — E079 Disorder of thyroid, unspecified: Secondary | ICD-10-CM

## 2021-10-23 DIAGNOSIS — E063 Autoimmune thyroiditis: Secondary | ICD-10-CM | POA: Insufficient documentation

## 2021-10-23 DIAGNOSIS — Z114 Encounter for screening for human immunodeficiency virus [HIV]: Secondary | ICD-10-CM | POA: Insufficient documentation

## 2021-10-23 DIAGNOSIS — M797 Fibromyalgia: Secondary | ICD-10-CM

## 2021-10-23 DIAGNOSIS — G4700 Insomnia, unspecified: Secondary | ICD-10-CM | POA: Diagnosis not present

## 2021-10-23 DIAGNOSIS — I10 Essential (primary) hypertension: Secondary | ICD-10-CM | POA: Insufficient documentation

## 2021-10-23 DIAGNOSIS — E559 Vitamin D deficiency, unspecified: Secondary | ICD-10-CM | POA: Diagnosis not present

## 2021-10-23 DIAGNOSIS — Z1159 Encounter for screening for other viral diseases: Secondary | ICD-10-CM | POA: Insufficient documentation

## 2021-10-23 DIAGNOSIS — Z13820 Encounter for screening for osteoporosis: Secondary | ICD-10-CM | POA: Insufficient documentation

## 2021-10-23 DIAGNOSIS — E042 Nontoxic multinodular goiter: Secondary | ICD-10-CM | POA: Insufficient documentation

## 2021-10-23 DIAGNOSIS — J4530 Mild persistent asthma, uncomplicated: Secondary | ICD-10-CM

## 2021-10-23 MED ORDER — AMITRIPTYLINE HCL 25 MG PO TABS: 125.0000 mg | ORAL_TABLET | Freq: Every day | ORAL | 3 refills | Status: DC

## 2021-10-23 MED ORDER — LORAZEPAM 0.5 MG PO TABS: 0.5000 mg | ORAL_TABLET | Freq: Every day | ORAL | 0 refills | Status: DC | PRN

## 2021-10-23 NOTE — Assessment & Plan Note (Signed)
Known concern; repeat lab work Remains on daily supplement

## 2021-10-23 NOTE — Assessment & Plan Note (Signed)
Given OA and long term use of steroids

## 2021-10-23 NOTE — Assessment & Plan Note (Signed)
No concerns, screening

## 2021-10-23 NOTE — Assessment & Plan Note (Signed)
Low risk screening °

## 2021-10-23 NOTE — Assessment & Plan Note (Signed)
F/b endocrine Neck soft, supple

## 2021-10-23 NOTE — Assessment & Plan Note (Signed)
Has f/u soon- did not want lab work done at this time

## 2021-10-23 NOTE — Assessment & Plan Note (Signed)
Stable with daily qHS medication

## 2021-10-23 NOTE — Assessment & Plan Note (Signed)
Prn xanax to assist with anxiety if unable to get to sleep outside of routine medication use; may use 1-2 times/week

## 2021-10-23 NOTE — Assessment & Plan Note (Signed)
Was started on new treatment; felt that it exacerbated her condition Pt concerned about daily use of medication with steroids Plans to discuss with pulm- did not want changes made to POC today

## 2021-10-23 NOTE — Assessment & Plan Note (Signed)
Recommend use of exercises to strengthen muscles surrounding knee Did not want referral to ortho at this time

## 2021-10-23 NOTE — Assessment & Plan Note (Signed)
Recommend starting medication Pt noted that HR was elevated at pulm visit- normal today Continue to monitor sodium intake and stress response

## 2021-10-23 NOTE — Assessment & Plan Note (Signed)
Continue to monitor nodules- in endocrine setting Pt denies concerns with thyroid r/t s/s

## 2021-10-24 LAB — COMPREHENSIVE METABOLIC PANEL
ALT: 16 IU/L (ref 0–32)
AST: 19 IU/L (ref 0–40)
Albumin/Globulin Ratio: 1.8 (ref 1.2–2.2)
Albumin: 4.8 g/dL (ref 3.8–4.8)
Alkaline Phosphatase: 86 IU/L (ref 44–121)
BUN/Creatinine Ratio: 11 — ABNORMAL LOW (ref 12–28)
BUN: 8 mg/dL (ref 8–27)
Bilirubin Total: <0.2 mg/dL (ref 0.0–1.2)
CO2: 26 mmol/L (ref 20–29)
Calcium: 9.4 mg/dL (ref 8.7–10.3)
Chloride: 105 mmol/L (ref 96–106)
Creatinine, Ser: 0.72 mg/dL (ref 0.57–1.00)
Globulin, Total: 2.7 g/dL (ref 1.5–4.5)
Glucose: 85 mg/dL (ref 70–99)
Potassium: 4.2 mmol/L (ref 3.5–5.2)
Sodium: 147 mmol/L — ABNORMAL HIGH (ref 134–144)
Total Protein: 7.5 g/dL (ref 6.0–8.5)
eGFR: 94 mL/min/{1.73_m2} (ref >59)

## 2021-10-24 LAB — VITAMIN D 25 HYDROXY (VIT D DEFICIENCY, FRACTURES): Vit D, 25-Hydroxy: 46.2 ng/mL (ref 30.0–100.0)

## 2021-10-24 LAB — HEPATITIS C ANTIBODY: Hep C Virus Ab: 0.1 s/co ratio (ref 0.0–0.9)

## 2021-10-24 LAB — HIV ANTIBODY (ROUTINE TESTING W REFLEX): HIV Screen 4th Generation wRfx: NONREACTIVE

## 2021-10-24 NOTE — Progress Notes (Signed)
Hello,    Your lab results have returned. It was a pleasure to see you in the office the other day.  Normal vitamin D levels; slight elevation of sodium on labs- ensure proper hydration.   All other labs negative/normal. Plan to check thyroid levels in Spring as you requested.  Please let us know if you have any questions.  Thank you,  Merita Norton, FNP

## 2021-10-25 ENCOUNTER — Other Ambulatory Visit: Payer: Self-pay | Admitting: Internal Medicine

## 2021-10-25 DIAGNOSIS — J4520 Mild intermittent asthma, uncomplicated: Secondary | ICD-10-CM

## 2021-10-26 ENCOUNTER — Telehealth: Payer: Self-pay | Admitting: *Deleted

## 2021-10-26 NOTE — Telephone Encounter (Signed)
Patient wanted to know how much calcium she should be taking? Please advise?

## 2021-10-27 NOTE — Telephone Encounter (Signed)
Lmtcb okay for [facility] nurse triage to advise. KW 

## 2021-11-23 DIAGNOSIS — E042 Nontoxic multinodular goiter: Secondary | ICD-10-CM | POA: Diagnosis not present

## 2021-11-23 DIAGNOSIS — E063 Autoimmune thyroiditis: Secondary | ICD-10-CM | POA: Diagnosis not present

## 2021-11-23 DIAGNOSIS — R5381 Other malaise: Secondary | ICD-10-CM | POA: Diagnosis not present

## 2021-11-23 DIAGNOSIS — E039 Hypothyroidism, unspecified: Secondary | ICD-10-CM | POA: Diagnosis not present

## 2021-11-23 DIAGNOSIS — Z7189 Other specified counseling: Secondary | ICD-10-CM | POA: Diagnosis not present

## 2021-11-30 DIAGNOSIS — E039 Hypothyroidism, unspecified: Secondary | ICD-10-CM | POA: Diagnosis not present

## 2021-11-30 DIAGNOSIS — E042 Nontoxic multinodular goiter: Secondary | ICD-10-CM | POA: Diagnosis not present

## 2021-11-30 DIAGNOSIS — K219 Gastro-esophageal reflux disease without esophagitis: Secondary | ICD-10-CM | POA: Diagnosis not present

## 2021-11-30 DIAGNOSIS — E785 Hyperlipidemia, unspecified: Secondary | ICD-10-CM | POA: Diagnosis not present

## 2021-11-30 DIAGNOSIS — E063 Autoimmune thyroiditis: Secondary | ICD-10-CM | POA: Diagnosis not present

## 2022-02-12 ENCOUNTER — Other Ambulatory Visit: Payer: Self-pay

## 2022-02-12 MED ORDER — SEREVENT DISKUS 50 MCG/ACT IN AEPB: 1.0000 | INHALATION_SPRAY | Freq: Two times a day (BID) | RESPIRATORY_TRACT | 3 refills | Status: DC

## 2022-02-12 NOTE — Telephone Encounter (Signed)
Pt called and advised that she was using the ADVAIR inhaler and she started losing her voice so she tried her old inhaler Serevent and it got better.  Pt tried the Advair again and done the same thing.  Pt is asking if ok to go back on the Serevent inhaler.  I spoke to Dr Freda Munro and he was ok with pt changing back to the Serevent inhaler.  I sent in new prescription to her pharmacy ?

## 2022-03-27 ENCOUNTER — Encounter: Payer: Self-pay | Admitting: Internal Medicine

## 2022-03-27 ENCOUNTER — Ambulatory Visit: Payer: BC Managed Care – PPO | Admitting: Internal Medicine

## 2022-03-27 VITALS — BP 130/80 | HR 77 | Temp 97.8°F | Resp 16 | Ht 69.0 in | Wt 155.0 lb

## 2022-03-27 DIAGNOSIS — I5032 Chronic diastolic (congestive) heart failure: Secondary | ICD-10-CM

## 2022-03-27 DIAGNOSIS — J4520 Mild intermittent asthma, uncomplicated: Secondary | ICD-10-CM

## 2022-03-27 DIAGNOSIS — R0602 Shortness of breath: Secondary | ICD-10-CM

## 2022-03-27 DIAGNOSIS — J301 Allergic rhinitis due to pollen: Secondary | ICD-10-CM | POA: Diagnosis not present

## 2022-03-27 NOTE — Patient Instructions (Signed)
Asthma, Adult  Asthma is a condition that causes swelling and narrowing of the airways. These are the passages that lead from the nose and mouth down into the lungs. When asthma symptoms get worse it is called an asthma attack or flare. This can make it hard to breathe. Asthma flares can range from minor to life-threatening. There is no cure for asthma, but medicines and lifestyle changes can help to control it. What are the causes? It is not known exactly what causes asthma, but certain things can cause asthma symptoms to get worse (triggers). What can trigger an asthma attack? Cigarette smoke. Mold. Dust. Your pet's skin flakes (dander). Cockroaches. Pollen. Air pollution (like household cleaners, wood smoke, smog, or chemical odors). What are the signs or symptoms? Trouble breathing (shortness of breath). Coughing. Making high-pitched whistling sounds when you breathe, most often when you breathe out (wheezing). Chest tightness. Tiredness with little activity. Poor exercise tolerance. How is this treated? Controller medicines that help prevent asthma symptoms. Fast-acting reliever or rescue medicines. These give short-term relief of asthma symptoms. Allergy medicines if your attacks are brought on by allergens. Medicines to help control the body's defense (immune) system. Staying away from the things that cause asthma attacks. Follow these instructions at home: Avoiding triggers in your home Do not allow anyone to smoke in your home. Limit use of fireplaces and wood stoves. Get rid of pests (such as roaches and mice) and their droppings. Keep your home clean. Clean your floors. Dust regularly. Use cleaning products that do not smell. Wash bed sheets and blankets every week in hot water. Dry them in a dryer. Have someone vacuum when you are not home. Change your heating and air conditioning filters often. Use blankets that are made of polyester or cotton. General  instructions Take over-the-counter and prescription medicines only as told by your doctor. Do not smoke or use any products that contain nicotine or tobacco. If you need help quitting, ask your doctor. Stay away from secondhand smoke. Avoid doing things outdoors when allergen counts are high and when air quality is low. Warm up before you exercise. Take time to cool down after exercise. Use a peak flow meter as told by your doctor. A peak flow meter is a tool that measures how well your lungs are working. Keep track of the peak flow meter's readings. Write them down. Follow your asthma action plan. This is a written plan for taking care of your asthma and treating your attacks. Make sure you get all the shots (vaccines) that your doctor recommends. Ask your doctor about a flu shot and a pneumonia shot. Keep all follow-up visits. Contact a doctor if: You have wheezing, shortness of breath, or a cough even while taking medicine to prevent attacks. The mucus you cough up (sputum) is thicker than usual. The mucus you cough up changes from clear or white to yellow, green, gray, or is bloody. You have problems from the medicine you are taking, such as: A rash. Itching. Swelling. Trouble breathing. You need reliever medicines more than 2-3 times a week. Your peak flow reading is still at 50-79% of your personal best after following the action plan for 1 hour. You have a fever. Get help right away if: You seem to be worse and are not responding to medicine during an asthma attack. You are short of breath even at rest. You get short of breath when doing very little activity. You have trouble eating, drinking, or talking. You have chest   pain or tightness. You have a fast heartbeat. Your lips or fingernails start to turn blue. You are light-headed or dizzy, or you faint. Your peak flow is less than 50% of your personal best. You feel too tired to breathe normally. These symptoms may be an  emergency. Get help right away. Call 911. Do not wait to see if the symptoms will go away. Do not drive yourself to the hospital. Summary Asthma is a long-term (chronic) condition in which the airways get tight and narrow. An asthma attack can make it hard to breathe. Asthma cannot be cured, but medicines and lifestyle changes can help control it. Make sure you understand how to avoid triggers and how and when to use your medicines. Avoid things that can cause allergy symptoms (allergens). These include animal skin flakes (dander) and pollen from trees or grass. Avoid things that pollute the air. These may include household cleaners, wood smoke, smog, or chemical odors. This information is not intended to replace advice given to you by your health care provider. Make sure you discuss any questions you have with your health care provider. Document Revised: 08/07/2021 Document Reviewed: 08/07/2021 Elsevier Patient Education  2023 Elsevier Inc.  

## 2022-03-27 NOTE — Progress Notes (Signed)
[facility],  [address]  Pulmonary Sleep Medicine   Office Visit Note  Patient Name: Lisa Robbins DOB: 02-May-1961 MRN [id number]  Date of Service: 03/27/2022  Complaints/HPI: States that she feels a little better. She has noted if the temp is higher she feels hot and also has had some swelling. She states this seems to happen at her parents house. Patient states that she has no frank edema. Patient feels flush at times with the hotter temperatures. Patient states that she is feeling better with the serevent as far as her breathing is concerned  ROS  General: (-) fever, (-) chills, (-) night sweats, (-) weakness Skin: (-) rashes, (-) itching,. Eyes: (-) visual changes, (-) redness, (-) itching. Nose and Sinuses: (-) nasal stuffiness or itchiness, (-) postnasal drip, (-) nosebleeds, (-) sinus trouble. Mouth and Throat: (-) sore throat, (-) hoarseness. Neck: (-) swollen glands, (-) enlarged thyroid, (-) neck pain. Respiratory: - cough, (-) bloody sputum, - shortness of breath, - wheezing. Cardiovascular: - ankle swelling, (-) chest pain. Lymphatic: (-) lymph node enlargement. Neurologic: (-) numbness, (-) tingling. Psychiatric: (-) anxiety, (-) depression   Current Medication: Outpatient Encounter Medications as of 03/27/2022  Medication Sig Note   albuterol (VENTOLIN HFA) 108 (90 Base) MCG/ACT inhaler TAKE 2 PUFFS BY MOUTH EVERY 6 HOURS AS NEEDED FOR WHEEZE OR SHORTNESS OF BREATH    amitriptyline (ELAVIL) 25 MG tablet Take 5 tablets (125 mg total) by mouth at bedtime.    esomeprazole (NEXIUM) 20 MG capsule Take 1 capsule (20 mg total) by mouth daily at 12 noon.    Glucosamine HCl-MSM (MSM GLUCOSAMINE) 375-375 MG CAPS Take by mouth. 09/02/2015: Received from: [facility]   LORazepam (ATIVAN) 0.5 MG tablet Take 1 tablet (0.5 mg total) by mouth daily as needed for anxiety.    Multiple Vitamin (MULTIVITAMIN) capsule Take 1  capsule by mouth daily.    Probiotic Product (PRO-BIOTIC BLEND PO) Take by mouth.    salmeterol (SEREVENT DISKUS) 50 MCG/ACT diskus inhaler Inhale 1 puff into the lungs 2 (two) times daily.    thyroid (ARMOUR) 60 MG tablet Take by mouth. 09/02/2015: Received from: [facility] (QC TUMERIC COMPLEX PO) Take by mouth. For inflammation    No facility-administered encounter medications on file as of 03/27/2022.    Surgical History: Past Surgical History:  Procedure Laterality Date   DIAGNOSTIC LAPAROSCOPY  01/18/1997   LLQ PAIN, DYSMENORRHEA. AUB   DILATION AND CURETTAGE OF UTERUS  01/18/1997   ABD PAIN   TONSILLECTOMY     UPPER GI ENDOSCOPY  05/11/91; 10/20/91; 10/28/91   WITH BALOON DILATION   URETHRAL DILATION  2007    Medical History: Past Medical History:  Diagnosis Date   Asthma    Esophageal stricture    GERD (gastroesophageal reflux disease)    Muscle weakness    Musculoskeletal pain of lower extremity    Sleep disorder    Thyroid disease    Hashimoto disease    Family History: Family History  Problem Relation Age of Onset   Stroke Mother    Hypertension Mother    Chronic fatigue Mother        chr fatique immune dysfunction   Diabetes Father    Cancer Maternal Grandmother 80       ovarian or uterine - not sure which   Cancer Maternal Grandfather        fam hx of leukemia   Diabetes Paternal  Grandmother    Diabetes Paternal Grandfather     Social History: Social History   Socioeconomic History   Marital status: Married    Spouse name: Lisa Robbins   Number of children: 1   Years of education: 16   Highest education level: Not on file  Occupational History   Occupation: Pharmacist, hospital  Tobacco Use   Smoking status: Never   Smokeless tobacco: Never  Vaping Use   Vaping Use: Never used  Substance and Sexual Activity   Alcohol use: No   Drug use: No   Sexual activity: Yes    Partners: Male    Birth control/protection: Post-menopausal   Other Topics Concern   Not on file  Social History Narrative   Not on file   Social Determinants of Health   Financial Resource Strain: Not on file  Food Insecurity: Not on file  Transportation Needs: Not on file  Physical Activity: Not on file  Stress: Not on file  Social Connections: Not on file  Intimate Partner Violence: Not on file    Vital Signs: Blood pressure 130/80, pulse 77, temperature 97.8 F (36.6 C), resp. rate 16, height 5\' 9"  (1.753 m), weight 155 lb (70.3 kg), SpO2 99 %.  Examination: General Appearance: The patient is well-developed, well-nourished, and in no distress. Skin: Gross inspection of skin unremarkable. Head: normocephalic, no gross deformities. Eyes: no gross deformities noted. ENT: ears appear grossly normal no exudates. Neck: Supple. No thyromegaly. No LAD. Respiratory: no rhonchi noted. Cardiovascular: Normal S1 and S2 without murmur or rub. Extremities: No cyanosis. pulses are equal. Neurologic: Alert and oriented. No involuntary movements.  LABS: No results found for this or any previous visit (from the past 2160 hour(s)).  Radiology: No results found.  No results found.  No results found.    Assessment and Plan: Patient Active Problem List   Diagnosis Date Noted   Hashimoto's disease 10/23/2021   Arthralgia of both knees 10/23/2021   Avitaminosis D 10/23/2021   Encounter for hepatitis C screening test for low risk patient 10/23/2021   Encounter for screening for HIV 10/23/2021   Screening for osteoporosis 10/23/2021   Encounter for screening mammogram for malignant neoplasm of breast 10/23/2021   Multiple thyroid nodules 10/23/2021   Primary hypertension 10/23/2021   Airway hyperreactivity 09/02/2015   Fibrositis 09/02/2015   Cannot sleep 09/02/2015   Disorder of thyroid 09/02/2015    1. SOB (shortness of breath) Follow-up PFT spirometry recommended.  Patient is doing okay overall still has some issues with shortness  of breath and palpitations suspect part of this is anxiety related - Spirometry with Graph  2. Chronic asthma, mild intermittent, uncomplicated Under good control patient will continue with her current regimen  3. Seasonal allergic rhinitis due to pollen Antihistamines as warranted  4. Chronic diastolic heart failure ([place]) She had some diastolic dysfunction noted on the echo. I HAVE SUGGESTED THAT SHE HAVE A 24H BP MONITORING AS SHE HAS HAD SOME RECORDED ELEVATED BP IN OUR OFFICE. May consider a cardiac opinion also.   General Counseling: I have discussed the findings of the evaluation and examination with Stachia.  I have also discussed any further diagnostic evaluation thatmay be needed or ordered today. Ivianna verbalizes understanding of the findings of todays visit. We also reviewed her medications today and discussed drug interactions and side effects including but not limited excessive drowsiness and altered mental states. We also discussed that there is always a risk not just to her but also people around  her. she has been encouraged to call the office with any questions or concerns that should arise related to todays visit.  Orders Placed This Encounter  Procedures   24 hour blood pressure monitor    Standing Status:   Future    Standing Expiration Date:   03/28/2023    Order Specific Question:   Where should this test be performed?    Answer:   [facility]   Spirometry with Graph    Order Specific Question:   Where should this test be performed?    Answer:   Other     Time spent: 40  I have personally obtained a history, examined the patient, evaluated laboratory and imaging results, formulated the assessment and plan and placed orders.    Allyne Gee, MD [facility] Pulmonary and Critical Care Sleep medicine

## 2022-05-22 NOTE — Progress Notes (Unsigned)
Cardiology Office Note  Date:  05/23/2022   ID:  Shailene, Demonbreun 1961-01-25, MRN [id number]  PCP:  Maple Hudson., MD   Chief Complaint  Patient presents with   New Patient (Initial Visit)    Self referral -- Patient reports SOB with exertion. Meds reviewed verbally with patient.     HPI:  [name] is a 61 year old woman with past medical history of Hypertension Dilated ascending aorta 4 cm Asthma, on inhaler, followed by pulmonary Chronic sob on exertion,  Who presents by referral from Dr.  Welton Flakes for consultation of her shortness of breath, palpitations  Significant work-up of her shortness of breath over the past year including echocardiogram Echo performed as outpatient reviewed Normal LV function, diastolic dysfunction noted, no significant valvular heart disease, normal right heart pressures, normal sized IVC  Reports long history of exertional shortness of breath, worse in the heat feeling weak with walking On inhaler, "helps" Has tried different types of inhalers and pulmonary medications and has some intolerances  Normal blood pressures on home checks Erratic heart beat at times  "Strange cough", sometimes sounds bronchospastic or loose with occasional wheezing  No regular exercise program  EKG personally reviewed by myself on todays visit NSR with rate 92 bpm, rare PVC no significant ST or T wave changes,  PMH:   has a past medical history of Asthma, Esophageal stricture, GERD (gastroesophageal reflux disease), Muscle weakness, Musculoskeletal pain of lower extremity, Sleep disorder, and Thyroid disease.  PSH:    Past Surgical History:  Procedure Laterality Date   DIAGNOSTIC LAPAROSCOPY  01/18/1997   LLQ PAIN, DYSMENORRHEA. AUB   DILATION AND CURETTAGE OF UTERUS  01/18/1997   ABD PAIN   TONSILLECTOMY     UPPER GI ENDOSCOPY  05/11/91; 10/20/91; 10/28/91   WITH BALOON DILATION   URETHRAL DILATION  2007    Current Outpatient Medications   Medication Sig Dispense Refill   albuterol (VENTOLIN HFA) 108 (90 Base) MCG/ACT inhaler TAKE 2 PUFFS BY MOUTH EVERY 6 HOURS AS NEEDED FOR WHEEZE OR SHORTNESS OF BREATH 8.5 each 3   amitriptyline (ELAVIL) 25 MG tablet Take 5 tablets (125 mg total) by mouth at bedtime. 450 tablet 3   esomeprazole (NEXIUM) 20 MG capsule Take 1 capsule (20 mg total) by mouth daily at 12 noon. 30 capsule 5   furosemide (LASIX) 20 MG tablet Take 1 tablet (20 mg total) by mouth daily as needed (shortness of breath). 30 tablet 3   Glucosamine HCl-MSM (MSM GLUCOSAMINE) 375-375 MG CAPS Take by mouth.     LORazepam (ATIVAN) 0.5 MG tablet Take 1 tablet (0.5 mg total) by mouth daily as needed for anxiety. 30 tablet 0   Multiple Vitamin (MULTIVITAMIN) capsule Take 1 capsule by mouth daily.     Probiotic Product (PRO-BIOTIC BLEND PO) Take by mouth.     salmeterol (SEREVENT DISKUS) 50 MCG/ACT diskus inhaler Inhale 1 puff into the lungs 2 (two) times daily. 60 each 3   thyroid (ARMOUR) 60 MG tablet Take by mouth.     Turmeric (QC TUMERIC COMPLEX PO) Take by mouth. For inflammation     Vitamin D3 (VITAMIN D) 25 MCG tablet Take 1,000 Units by mouth daily.     No current facility-administered medications for this visit.     Allergies:   Dust mite extract   Social History:  The patient  reports that she has never smoked. She has never used smokeless tobacco. She reports that she does not drink  alcohol and does not use drugs.   Family History:   family history includes Cancer in her maternal grandfather; Cancer (age of onset: 12) in her maternal grandmother; Chronic fatigue in her mother; Diabetes in her father, paternal grandfather, and paternal grandmother; Hypertension in her mother; Stroke in her mother.    Review of Systems: Review of Systems  Constitutional: Negative.   HENT: Negative.    Respiratory:  Positive for shortness of breath.   Cardiovascular:  Positive for palpitations.  Gastrointestinal: Negative.    Musculoskeletal: Negative.   Neurological: Negative.   Psychiatric/Behavioral: Negative.    All other systems reviewed and are negative.   PHYSICAL EXAM: VS:  BP (!) 150/84 (BP Location: Right Arm, Patient Position: Sitting, Cuff Size: Normal)   Pulse 92   Ht 5\' 9"  (1.753 m)   Wt 153 lb (69.4 kg)   SpO2 96%   BMI 22.59 kg/m  , BMI Body mass index is 22.59 kg/m. GEN: Well nourished, well developed, in no acute distress HEENT: normal Neck: no JVD, carotid bruits, or masses Cardiac: RRR; no murmurs, rubs, or gallops,no edema  Respiratory:  clear to auscultation bilaterally, normal work of breathing GI: soft, nontender, nondistended, + BS MS: no deformity or atrophy Skin: warm and dry, no rash Neuro:  Strength and sensation are intact Psych: euthymic mood, full affect   Recent Labs: 10/23/2021: ALT 16; BUN 8; Creatinine, Ser 0.72; Potassium 4.2; Sodium 147    Lipid Panel Lab Results  Component Value Date   CHOL 197 10/31/2012   HDL 98 (A) 10/31/2012   LDLCALC 91 10/31/2012   TRIG 40 10/31/2012      Wt Readings from Last 3 Encounters:  05/23/22 153 lb (69.4 kg)  03/27/22 155 lb (70.3 kg)  10/23/21 156 lb 3.2 oz (70.9 kg)     ASSESSMENT AND PLAN:  Problem List Items Addressed This Visit     Airway hyperreactivity   Other Visit Diagnoses     SOB (shortness of breath)    -  Primary   Relevant Orders   EKG 12-Lead   AMB referral to pulmonary rehabilitation   Weakness       Diastolic dysfunction          Chronic shortness of breath Worse in hot weather, with weakness,  Likely exacerbated by underlying asthma, Unable to exclude small component of diastolic dysfunction though echocardiogram and clinical exam not consistent with diastolic CHF Recommend she could perform a trial of Lasix to see if this helps her symptoms Though suspect this would likely be of limited benefit -Given significant debility, would likely have significant benefit from the long  works program Referral placed, will hope she will qualify Symptoms less likely from stable angina, few risk factors for underlying coronary disease  Palpitations Likely secondary to ectopy, PVCs as noted on EKG today We will avoid beta-blockers in the setting of asthma   Total encounter time more than 60 minutes Greater than 50% was spent in counseling and coordination of care with the patient  Patient was seen in consultation for Dr. 14/12/22 and Dr. Sullivan Lone will be referred back to their office for ongoing care of the issues detailed above  Signed, Welton Flakes, M.D., Ph.D. [facility] [place], [place]

## 2022-05-23 ENCOUNTER — Encounter: Payer: Self-pay | Admitting: Cardiovascular Disease

## 2022-05-23 ENCOUNTER — Ambulatory Visit: Payer: BC Managed Care – PPO | Admitting: Cardiovascular Disease

## 2022-05-23 VITALS — BP 150/84 | HR 92 | Ht 69.0 in | Wt 153.0 lb

## 2022-05-23 DIAGNOSIS — R0602 Shortness of breath: Secondary | ICD-10-CM

## 2022-05-23 DIAGNOSIS — I5189 Other ill-defined heart diseases: Secondary | ICD-10-CM

## 2022-05-23 DIAGNOSIS — R531 Weakness: Secondary | ICD-10-CM

## 2022-05-23 DIAGNOSIS — J4540 Moderate persistent asthma, uncomplicated: Secondary | ICD-10-CM | POA: Diagnosis not present

## 2022-05-23 MED ORDER — FUROSEMIDE 20 MG PO TABS: 20.0000 mg | ORAL_TABLET | Freq: Every day | ORAL | 3 refills | Status: DC | PRN

## 2022-05-23 NOTE — Patient Instructions (Addendum)
You have been referred to pulmonary rehab. They will call you to set up an appointment.    Medication Instructions:  Lasix 20 mg daily as needed for shortness of breath  If you need a refill on your cardiac medications before your next appointment, please call your pharmacy.   Lab work: No new labs needed  Testing/Procedures: No new testing needed  Follow-Up: At [facility], you and your health needs are our priority.  As part of our continuing mission to provide you with exceptional heart care, we have created designated Provider Care Teams.  These Care Teams include your primary Cardiologist (physician) and Advanced Practice Providers (APPs -  Physician Assistants and Nurse Practitioners) who all work together to provide you with the care you need, when you need it.  You will need a follow up appointment as needed  Providers on your designated Care Team:   Nicolasa Ducking, NP Eula Listen, PA-C Cadence Fransico [place], [place]  COVID-19 Vaccine Information can be found at: [email or web address] For questions related to vaccine distribution or appointments, please email [email or web address] or call [phone].

## 2022-07-03 ENCOUNTER — Ambulatory Visit: Payer: BC Managed Care – PPO | Admitting: Internal Medicine

## 2022-08-02 ENCOUNTER — Telehealth (INDEPENDENT_AMBULATORY_CARE_PROVIDER_SITE_OTHER): Payer: BC Managed Care – PPO | Admitting: Family Medicine

## 2022-08-02 DIAGNOSIS — M797 Fibromyalgia: Secondary | ICD-10-CM | POA: Diagnosis not present

## 2022-08-02 DIAGNOSIS — K219 Gastro-esophageal reflux disease without esophagitis: Secondary | ICD-10-CM

## 2022-08-02 DIAGNOSIS — E079 Disorder of thyroid, unspecified: Secondary | ICD-10-CM | POA: Diagnosis not present

## 2022-08-02 DIAGNOSIS — M25561 Pain in right knee: Secondary | ICD-10-CM | POA: Diagnosis not present

## 2022-08-02 DIAGNOSIS — I872 Venous insufficiency (chronic) (peripheral): Secondary | ICD-10-CM

## 2022-08-02 DIAGNOSIS — K2100 Gastro-esophageal reflux disease with esophagitis, without bleeding: Secondary | ICD-10-CM

## 2022-08-02 DIAGNOSIS — M25562 Pain in left knee: Secondary | ICD-10-CM

## 2022-08-02 DIAGNOSIS — J4530 Mild persistent asthma, uncomplicated: Secondary | ICD-10-CM

## 2022-08-02 DIAGNOSIS — G4700 Insomnia, unspecified: Secondary | ICD-10-CM

## 2022-08-02 DIAGNOSIS — F419 Anxiety disorder, unspecified: Secondary | ICD-10-CM

## 2022-08-02 MED ORDER — AMITRIPTYLINE HCL 25 MG PO TABS: 125.0000 mg | ORAL_TABLET | Freq: Every day | ORAL | 1 refills | Status: AC

## 2022-08-02 MED ORDER — ESOMEPRAZOLE MAGNESIUM 20 MG PO CPDR: 20.0000 mg | DELAYED_RELEASE_CAPSULE | Freq: Every day | ORAL | 5 refills | Status: DC

## 2022-08-02 MED ORDER — LORAZEPAM 0.5 MG PO TABS: 0.5000 mg | ORAL_TABLET | Freq: Every day | ORAL | 1 refills | Status: AC | PRN

## 2022-08-02 NOTE — Progress Notes (Signed)
MyChart Video Visit    Virtual Visit via Video Note   This visit type was conducted due to national recommendations for restrictions regarding the COVID-19 Pandemic (e.g. social distancing) in an effort to limit this patient's exposure and mitigate transmission in our community. This patient is at least at moderate risk for complications without adequate follow up. This format is felt to be most appropriate for this patient at this time. Physical exam was limited by quality of the video and audio technology used for the visit.   Patient location: Home Provider location: Office  I discussed the limitations of evaluation and management by telemedicine and the availability of in person appointments. The patient expressed understanding and agreed to proceed.  Patient: Lisa Robbins   DOB: Oct 09, 1961   61 y.o. Female  MRN: [id number] Visit Date: 08/02/2022  Today's healthcare provider: Wilhemena Durie, MD   No chief complaint on file.  Subjective    HPI  Patient has a video visit for refill of her Elavil and her lorazepam and Nexium.  He is stable on all those things.  We went through that she has seen cardiology Dr. Rockey Situ recently for erratic heart rate and swelling in ankles and feet for which she put her on Lasix as needed.  He is having her follow-up with pulmonary about asthma that might be contributing to some of this. She and her husband had to put her mother-in-law in a assisted living and had to clean out her house over the course of the month.  It was a lot of hard work.   Medications: Outpatient Medications Prior to Visit  Medication Sig   albuterol (VENTOLIN HFA) 108 (90 Base) MCG/ACT inhaler TAKE 2 PUFFS BY MOUTH EVERY 6 HOURS AS NEEDED FOR WHEEZE OR SHORTNESS OF BREATH   amitriptyline (ELAVIL) 25 MG tablet Take 5 tablets (125 mg total) by mouth at bedtime.   esomeprazole (NEXIUM) 20 MG capsule Take 1 capsule (20 mg total) by mouth daily at 12 noon.   furosemide  (LASIX) 20 MG tablet Take 1 tablet (20 mg total) by mouth daily as needed (shortness of breath).   Glucosamine HCl-MSM (MSM GLUCOSAMINE) 375-375 MG CAPS Take by mouth.   LORazepam (ATIVAN) 0.5 MG tablet Take 1 tablet (0.5 mg total) by mouth daily as needed for anxiety.   Multiple Vitamin (MULTIVITAMIN) capsule Take 1 capsule by mouth daily.   Probiotic Product (PRO-BIOTIC BLEND PO) Take by mouth.   salmeterol (SEREVENT DISKUS) 50 MCG/ACT diskus inhaler Inhale 1 puff into the lungs 2 (two) times daily.   thyroid (ARMOUR) 60 MG tablet Take by mouth.   Turmeric (QC TUMERIC COMPLEX PO) Take by mouth. For inflammation   Vitamin D3 (VITAMIN D) 25 MCG tablet Take 1,000 Units by mouth daily.   No facility-administered medications prior to visit.    Review of Systems  Last metabolic panel Lab Results  Component Value Date   GLUCOSE 85 10/23/2021   NA 147 (H) 10/23/2021   K 4.2 10/23/2021   CL 105 10/23/2021   CO2 26 10/23/2021   BUN 8 10/23/2021   CREATININE 0.72 10/23/2021   EGFR 94 10/23/2021   CALCIUM 9.4 10/23/2021   PROT 7.5 10/23/2021   ALBUMIN 4.8 10/23/2021   LABGLOB 2.7 10/23/2021   AGRATIO 1.8 10/23/2021   BILITOT <0.2 10/23/2021   ALKPHOS 86 10/23/2021   AST 19 10/23/2021   ALT 16 10/23/2021   ANIONGAP 4 (L) 01/05/2013  Objective    There were no vitals taken for this visit.  BP Readings from Last 3 Encounters:  05/23/22 (!) 150/84  03/27/22 130/80  10/23/21 (!) 146/81   Wt Readings from Last 3 Encounters:  05/23/22 153 lb (69.4 kg)  03/27/22 155 lb (70.3 kg)  10/23/21 156 lb 3.2 oz (70.9 kg)       Physical Exam     Assessment & Plan     1. Arthralgia of both knees Turmeric daily.  May need orthopedic referral  2. Fibrositis Try cutting back on Elavil to up to 5 a day to no more than 4 a day.  She states she rarely takes more than 3-3-1/2 daily.  3. Disorder of thyroid By Dr. Ronnald Collum  4. Gastroesophageal reflux disease, unspecified  whether esophagitis present Refill Nexium  5. Mild persistent asthma without complication Per pulmonology  6. Chronic venous insufficiency Lasix as needed which I encouraged her not to use if she is not having significant swelling  7. Anxiety Refill lorazepam for very infrequent use   No follow-ups on file.     I discussed the assessment and treatment plan with the patient. The patient was provided an opportunity to ask questions and all were answered. The patient agreed with the plan and demonstrated an understanding of the instructions.   The patient was advised to call back or seek an in-person evaluation if the symptoms worsen or if the condition fails to improve as anticipated.  I provided 15 minutes of non-face-to-face time during this encounter.  I, Wilhemena Durie, MD, have reviewed all documentation for this visit. The documentation on 08/02/22 for the exam, diagnosis, procedures, and orders are all accurate and complete.   Areonna Bran Cranford Mon, MD [facility] [phone] (phone) [phone] (fax)  [place]

## 2022-09-01 ENCOUNTER — Other Ambulatory Visit: Payer: Self-pay | Admitting: Cardiovascular Disease

## 2022-09-15 ENCOUNTER — Other Ambulatory Visit: Payer: Self-pay | Admitting: Family Medicine

## 2022-09-15 ENCOUNTER — Other Ambulatory Visit: Payer: Self-pay | Admitting: Internal Medicine

## 2022-09-15 DIAGNOSIS — M797 Fibromyalgia: Secondary | ICD-10-CM

## 2022-11-21 DIAGNOSIS — Z0000 Encounter for general adult medical examination without abnormal findings: Secondary | ICD-10-CM | POA: Diagnosis not present

## 2022-12-13 DIAGNOSIS — E063 Autoimmune thyroiditis: Secondary | ICD-10-CM | POA: Diagnosis not present

## 2022-12-13 DIAGNOSIS — E039 Hypothyroidism, unspecified: Secondary | ICD-10-CM | POA: Diagnosis not present

## 2022-12-13 DIAGNOSIS — E042 Nontoxic multinodular goiter: Secondary | ICD-10-CM | POA: Diagnosis not present

## 2022-12-13 DIAGNOSIS — K219 Gastro-esophageal reflux disease without esophagitis: Secondary | ICD-10-CM | POA: Diagnosis not present

## 2023-01-02 DIAGNOSIS — E039 Hypothyroidism, unspecified: Secondary | ICD-10-CM | POA: Diagnosis not present

## 2023-01-02 DIAGNOSIS — E063 Autoimmune thyroiditis: Secondary | ICD-10-CM | POA: Diagnosis not present

## 2023-01-02 DIAGNOSIS — N943 Premenstrual tension syndrome: Secondary | ICD-10-CM | POA: Diagnosis not present

## 2023-01-02 DIAGNOSIS — E042 Nontoxic multinodular goiter: Secondary | ICD-10-CM | POA: Diagnosis not present

## 2023-01-02 DIAGNOSIS — E785 Hyperlipidemia, unspecified: Secondary | ICD-10-CM | POA: Diagnosis not present

## 2023-01-02 DIAGNOSIS — K219 Gastro-esophageal reflux disease without esophagitis: Secondary | ICD-10-CM | POA: Diagnosis not present

## 2023-01-15 ENCOUNTER — Ambulatory Visit: Payer: BC Managed Care – PPO | Admitting: Internal Medicine

## 2023-01-15 ENCOUNTER — Encounter: Payer: Self-pay | Admitting: Internal Medicine

## 2023-01-15 VITALS — BP 166/97 | HR 105 | Temp 98.0°F | Resp 16 | Ht 69.0 in | Wt 152.4 lb

## 2023-01-15 DIAGNOSIS — Q394 Esophageal web: Secondary | ICD-10-CM

## 2023-01-15 DIAGNOSIS — J301 Allergic rhinitis due to pollen: Secondary | ICD-10-CM

## 2023-01-15 DIAGNOSIS — J4520 Mild intermittent asthma, uncomplicated: Secondary | ICD-10-CM

## 2023-01-15 DIAGNOSIS — R079 Chest pain, unspecified: Secondary | ICD-10-CM

## 2023-01-15 DIAGNOSIS — I5032 Chronic diastolic (congestive) heart failure: Secondary | ICD-10-CM

## 2023-01-15 NOTE — Progress Notes (Signed)
[facility] [place], [place] [address]  Pulmonary Sleep Medicine   Office Visit Note  Patient Name: Lisa Robbins DOB: 08-26-1961 MRN [id number]  Date of Service: 01/15/2023  Complaints/HPI: Shortness of breath.  Patient has had a longstanding history of having shortness of breath.  She has had a fairly extensive workup including CT of the chest as well as upper GI study PFTs and allergy testing.  She states that when she gets hot she has more difficulty with her breathing.  She feels much better in cooler situations.  Her son apparently bought her a portable fan which she puts around her neck and this seems to help her with her breathing symptoms.  Today I discussed with her the possibility that since we cannot really find a clear-cut pulmonary cause that this is possible that she could be having some postmenopausal issues.  I did ask her to discuss this with her primary care physician.  She is not on any kind of hormone replacement therapy natural or otherwise.  The other possibility is that she could have anxiety and panic attacks.  She has been to the cardiologist and they did not feel that there was anything significant.  She also has been evaluated in [facility] when there was a concern of a dissecting aortic aneurysm but there was nothing found at that time.  What I suggested to her is that we repeat the breathing test to see if there has been any change compared to last year.  ROS  General: (-) fever, (-) chills, (-) night sweats, (-) weakness Skin: (-) rashes, (-) itching,. Eyes: (-) visual changes, (-) redness, (-) itching. Nose and Sinuses: (-) nasal stuffiness or itchiness, (-) postnasal drip, (-) nosebleeds, (-) sinus trouble. Mouth and Throat: (-) sore throat, (-) hoarseness. Neck: (-) swollen glands, (-) enlarged thyroid, (-) neck pain. Respiratory: - cough, (-) bloody sputum, + shortness of breath, - wheezing. Cardiovascular: - ankle swelling, (-) chest  pain. Lymphatic: (-) lymph node enlargement. Neurologic: (-) numbness, (-) tingling. Psychiatric: (-) anxiety, (-) depression   Current Medication: Outpatient Encounter Medications as of 01/15/2023  Medication Sig   albuterol (VENTOLIN HFA) 108 (90 Base) MCG/ACT inhaler TAKE 2 PUFFS BY MOUTH EVERY 6 HOURS AS NEEDED FOR WHEEZE OR SHORTNESS OF BREATH   amitriptyline (ELAVIL) 25 MG tablet Take 5 tablets (125 mg total) by mouth at bedtime. Up to 4 tabs q hs prn   esomeprazole (NEXIUM) 20 MG capsule Take 1 capsule (20 mg total) by mouth daily at 12 noon.   furosemide (LASIX) 20 MG tablet TAKE 1 TABLET (20 MG TOTAL) BY MOUTH DAILY AS NEEDED (SHORTNESS OF BREATH).   Glucosamine HCl-MSM (MSM GLUCOSAMINE) 375-375 MG CAPS Take by mouth.   LORazepam (ATIVAN) 0.5 MG tablet Take 1 tablet (0.5 mg total) by mouth daily as needed for anxiety.   Multiple Vitamin (MULTIVITAMIN) capsule Take 1 capsule by mouth daily.   Probiotic Product (PRO-BIOTIC BLEND PO) Take by mouth.   SEREVENT DISKUS 50 MCG/ACT diskus inhaler INHALE 1 PUFF INTO THE LUNGS TWICE A DAY   thyroid (ARMOUR) 60 MG tablet Take by mouth.   Turmeric (QC TUMERIC COMPLEX PO) Take by mouth. For inflammation   Vitamin D3 (VITAMIN D) 25 MCG tablet Take 1,000 Units by mouth daily.   No facility-administered encounter medications on file as of 01/15/2023.    Surgical History: Past Surgical History:  Procedure Laterality Date   DIAGNOSTIC LAPAROSCOPY  01/18/1997   LLQ PAIN, DYSMENORRHEA.  AUB   DILATION AND CURETTAGE OF UTERUS  01/18/1997   ABD PAIN   TONSILLECTOMY     UPPER GI ENDOSCOPY  05/11/91; 10/20/91; 10/28/91   WITH BALOON DILATION   URETHRAL DILATION  2007    Medical History: Past Medical History:  Diagnosis Date   Asthma    Esophageal stricture    GERD (gastroesophageal reflux disease)    Muscle weakness    Musculoskeletal pain of lower extremity    Sleep disorder    Thyroid disease    Hashimoto disease    Family  History: Family History  Problem Relation Age of Onset   Stroke Mother    Hypertension Mother    Chronic fatigue Mother        chr fatique immune dysfunction   Diabetes Father    Cancer Maternal Grandmother 42       ovarian or uterine - not sure which   Cancer Maternal Grandfather        fam hx of leukemia   Diabetes Paternal Grandmother    Diabetes Paternal Grandfather     Social History: Social History   Socioeconomic History   Marital status: Married    Spouse name: Gwyndolyn Saxon   Number of children: 1   Years of education: 16   Highest education level: Not on file  Occupational History   Occupation: Pharmacist, hospital  Tobacco Use   Smoking status: Never   Smokeless tobacco: Never  Vaping Use   Vaping Use: Never used  Substance and Sexual Activity   Alcohol use: No   Drug use: No   Sexual activity: Yes    Partners: Male    Birth control/protection: Post-menopausal  Other Topics Concern   Not on file  Social History Narrative   Not on file   Social Determinants of Health   Financial Resource Strain: Not on file  Food Insecurity: Not on file  Transportation Needs: Not on file  Physical Activity: Not on file  Stress: Not on file  Social Connections: Not on file  Intimate Partner Violence: Not on file    Vital Signs: Blood pressure (!) 166/97, pulse (!) 105, temperature 98 F (36.7 C), resp. rate 16, height '5\' 9"'$  (1.753 m), weight 152 lb 6.4 oz (69.1 kg), SpO2 99 %.  Examination: General Appearance: The patient is well-developed, well-nourished, and in no distress. Skin: Gross inspection of skin unremarkable. Head: normocephalic, no gross deformities. Eyes: no gross deformities noted. ENT: ears appear grossly normal no exudates. Neck: Supple. No thyromegaly. No LAD. Respiratory: no rhonchi noted. Cardiovascular: Normal S1 and S2 without murmur or rub. Extremities: No cyanosis. pulses are equal. Neurologic: Alert and oriented. No involuntary movements.  LABS: No  results found for this or any previous visit (from the past 2160 hour(s)).  Radiology: No results found.  No results found.  No results found.    Assessment and Plan: Patient Active Problem List   Diagnosis Date Noted   Hashimoto's disease 10/23/2021   Arthralgia of both knees 10/23/2021   Avitaminosis D 10/23/2021   Encounter for hepatitis C screening test for low risk patient 10/23/2021   Encounter for screening for HIV 10/23/2021   Screening for osteoporosis 10/23/2021   Encounter for screening mammogram for malignant neoplasm of breast 10/23/2021   Multiple thyroid nodules 10/23/2021   Primary hypertension 10/23/2021   Airway hyperreactivity 09/02/2015   Fibrositis 09/02/2015   Cannot sleep 09/02/2015   Disorder of thyroid 09/02/2015    .1. Chronic asthma, mild intermittent, uncomplicated Will  get a spirometry and breathing test scheduled for her to do follow-up as far as her breathing is concerned.  In the meantime she is on Serevent she states that this does seem to help but the cooler weather seems to help her more.  Because of her concerns for asthma and difficulty breathing she wanted a letter for dismissal from jury duty. - Spirometry with graph; Future  2. Seasonal allergic rhinitis due to pollen She was requesting that we retest her for the allergies I will send her to [facility] and do a blood for allergy studies  3. Chronic diastolic heart failure ([place]) This has been evaluated by cardiology not felt to be anything significant at this time  4. Esophageal web Diagnosed over 10 years ago she did get dilatation has not been back to the GI since then I recommended that we get a follow-up GI evaluation - DG UGI W SMALL BOWEL; Future  5. Chest pain, unspecified type CT scan of the chest will be reordered - CT Chest High Resolution; Future   General Counseling: I have discussed the findings of the evaluation and examination with Mykhia.  I have also discussed any  further diagnostic evaluation thatmay be needed or ordered today. Chey verbalizes understanding of the findings of todays visit. We also reviewed her medications today and discussed drug interactions and side effects including but not limited excessive drowsiness and altered mental states. We also discussed that there is always a risk not just to her but also people around her. she has been encouraged to call the office with any questions or concerns that should arise related to todays visit.  Orders Placed This Encounter  Procedures   Allergy Test    Order Specific Question:   Allergy test to perform    Answer:   [place] and animals   DG UGI W SMALL BOWEL    Standing Status:   Future    Standing Expiration Date:   01/15/2024    Order Specific Question:   Reason for Exam (SYMPTOM  OR DIAGNOSIS REQUIRED)    Answer:   esophageal web    Order Specific Question:   Preferred Imaging Location?    Answer:   [facility]   CT Chest High Resolution    Standing Status:   Future    Standing Expiration Date:   01/15/2024    Order Specific Question:   Preferred imaging location?    Answer:   [facility]   Spirometry with graph    Standing Status:   Future    Standing Expiration Date:   01/15/2024    Order Specific Question:   Where should this test be performed?    Answer:   [facility]     Time spent: 76  I have personally obtained a history, examined the patient, evaluated laboratory and imaging results, formulated the assessment and plan and placed orders.    Allyne Gee, MD [facility] Pulmonary and Critical Care Sleep medicine

## 2023-01-18 ENCOUNTER — Telehealth: Payer: Self-pay | Admitting: Internal Medicine

## 2023-01-18 NOTE — Telephone Encounter (Signed)
Lvm to ask why patient canceled UGI & CT-Toni

## 2023-01-21 ENCOUNTER — Telehealth: Payer: Self-pay | Admitting: Internal Medicine

## 2023-01-21 NOTE — Telephone Encounter (Signed)
Patient stated her cardiologist wants her to do pulmonary therapy. She and her husband want to wait until after therapy to have CT and UGI done-Toni

## 2023-01-28 ENCOUNTER — Ambulatory Visit: Payer: BC Managed Care – PPO

## 2023-01-29 ENCOUNTER — Other Ambulatory Visit: Payer: BC Managed Care – PPO

## 2023-01-29 ENCOUNTER — Telehealth: Payer: Self-pay | Admitting: Internal Medicine

## 2023-01-29 NOTE — Telephone Encounter (Signed)
Lvm to schedule pft per dsk-Toni

## 2023-01-31 ENCOUNTER — Other Ambulatory Visit: Payer: Self-pay

## 2023-01-31 DIAGNOSIS — R0602 Shortness of breath: Secondary | ICD-10-CM

## 2023-02-27 ENCOUNTER — Ambulatory Visit: Payer: BC Managed Care – PPO | Admitting: Internal Medicine

## 2023-02-27 DIAGNOSIS — R0602 Shortness of breath: Secondary | ICD-10-CM | POA: Diagnosis not present

## 2023-03-19 NOTE — Procedures (Signed)
[facility] [address] [place], [address]    Complete Pulmonary Function Testing Interpretation:  FINDINGS:  Forced vital capacity is normal.  FEV1 is normal.  FEV1 FVC ratio was normal.  Postbronchodilator no significant change in the FEV1.  Total lung capacity is mildly decreased.  Residual volume is decreased.  Residual in the lung capacity ratio is decreased.  FRC is decreased.  DLCO was within normal limits.  IMPRESSION:  This pulmonary function study is within normal limits clinical correlation is recommended  Yevonne Pax, MD [facility] Pulmonary Critical Care Medicine Sleep Medicine

## 2023-04-10 ENCOUNTER — Telehealth: Payer: Self-pay | Admitting: Internal Medicine

## 2023-04-10 ENCOUNTER — Other Ambulatory Visit: Payer: Self-pay | Admitting: Internal Medicine

## 2023-04-10 NOTE — Telephone Encounter (Signed)
Lvm to move 04/22/23 appointment to earlier time that morning-Toni

## 2023-04-22 ENCOUNTER — Ambulatory Visit: Payer: BC Managed Care – PPO | Admitting: Internal Medicine

## 2023-04-22 ENCOUNTER — Encounter: Payer: Self-pay | Admitting: Internal Medicine

## 2023-04-22 ENCOUNTER — Telehealth: Payer: Self-pay | Admitting: Internal Medicine

## 2023-04-22 VITALS — BP 138/78 | HR 104 | Temp 97.8°F | Resp 16 | Ht 69.0 in | Wt 152.4 lb

## 2023-04-22 DIAGNOSIS — I5032 Chronic diastolic (congestive) heart failure: Secondary | ICD-10-CM | POA: Diagnosis not present

## 2023-04-22 DIAGNOSIS — J4520 Mild intermittent asthma, uncomplicated: Secondary | ICD-10-CM | POA: Diagnosis not present

## 2023-04-22 DIAGNOSIS — J301 Allergic rhinitis due to pollen: Secondary | ICD-10-CM

## 2023-04-22 DIAGNOSIS — Q394 Esophageal web: Secondary | ICD-10-CM | POA: Diagnosis not present

## 2023-04-22 DIAGNOSIS — K219 Gastro-esophageal reflux disease without esophagitis: Secondary | ICD-10-CM

## 2023-04-22 MED ORDER — ESOMEPRAZOLE MAGNESIUM 40 MG PO CPDR
40.0000 mg | DELAYED_RELEASE_CAPSULE | Freq: Every day | ORAL | 4 refills | Status: AC
Start: 2023-04-22 — End: ?

## 2023-04-22 MED ORDER — SEREVENT DISKUS 50 MCG/ACT IN AEPB
1.0000 | INHALATION_SPRAY | Freq: Two times a day (BID) | RESPIRATORY_TRACT | 12 refills | Status: DC
Start: 2023-04-22 — End: 2024-01-06

## 2023-04-22 NOTE — Telephone Encounter (Signed)
Patient to be scheduled in September per patient. She will call me when ready to schedule-Toni

## 2023-04-22 NOTE — Progress Notes (Signed)
[facility] [address] [place], [place] [address]  Pulmonary Sleep Medicine   Office Visit Note  Patient Name: Lisa Robbins DOB: [birth year] MRN [id number]  Date of Service: 04/22/2023  Complaints/HPI: Patient is back for shortness of breath palpitations she continues to have the symptoms although we have not been able to pinpoint exactly what may be causing them.  She does apparently drink caffeinated beverages and this may very well be contributing to PVCs and palpitations that she has been experiencing.  She is otherwise doing relatively well no new major events have been noted.  Office Spirometry Results:     ROS  General: (-) fever, (-) chills, (-) night sweats, (-) weakness Skin: (-) rashes, (-) itching,. Eyes: (-) visual changes, (-) redness, (-) itching. Nose and Sinuses: (-) nasal stuffiness or itchiness, (-) postnasal drip, (-) nosebleeds, (-) sinus trouble. Mouth and Throat: (-) sore throat, (-) hoarseness. Neck: (-) swollen glands, (-) enlarged thyroid, (-) neck pain. Respiratory: - cough, (-) bloody sputum, + shortness of breath, - wheezing. Cardiovascular: - ankle swelling, (-) chest pain. Lymphatic: (-) lymph node enlargement. Neurologic: (-) numbness, (-) tingling. Psychiatric: (-) anxiety, (-) depression   Current Medication: Outpatient Encounter Medications as of 04/22/2023  Medication Sig   albuterol (VENTOLIN HFA) 108 (90 Base) MCG/ACT inhaler TAKE 2 PUFFS BY MOUTH EVERY 6 HOURS AS NEEDED FOR WHEEZE OR SHORTNESS OF BREATH   amitriptyline (ELAVIL) 25 MG tablet Take 5 tablets (125 mg total) by mouth at bedtime. Up to 4 tabs q hs prn   esomeprazole (NEXIUM) 20 MG capsule Take 1 capsule (20 mg total) by mouth daily at 12 noon.   furosemide (LASIX) 20 MG tablet TAKE 1 TABLET (20 MG TOTAL) BY MOUTH DAILY AS NEEDED (SHORTNESS OF BREATH).   Glucosamine HCl-MSM (MSM GLUCOSAMINE) 375-375 MG CAPS Take by mouth.   LORazepam (ATIVAN) 0.5 MG tablet Take  1 tablet (0.5 mg total) by mouth daily as needed for anxiety.   Multiple Vitamin (MULTIVITAMIN) capsule Take 1 capsule by mouth daily.   Probiotic Product (PRO-BIOTIC BLEND PO) Take by mouth.   SEREVENT DISKUS 50 MCG/ACT diskus inhaler INHALE 1 PUFF INTO THE LUNGS TWICE A DAY   thyroid (ARMOUR) 60 MG tablet Take by mouth.   Turmeric (QC TUMERIC COMPLEX PO) Take by mouth. For inflammation   Vitamin D3 (VITAMIN D) 25 MCG tablet Take 1,000 Units by mouth daily.   No facility-administered encounter medications on file as of 04/22/2023.    Surgical History: Past Surgical History:  Procedure Laterality Date   DIAGNOSTIC LAPAROSCOPY  01/18/1997   LLQ PAIN, DYSMENORRHEA. AUB   DILATION AND CURETTAGE OF UTERUS  01/18/1997   ABD PAIN   TONSILLECTOMY     UPPER GI ENDOSCOPY  05/11/91; 10/20/91; 10/28/91   WITH BALOON DILATION   URETHRAL DILATION  2007    Medical History: Past Medical History:  Diagnosis Date   Asthma    Esophageal stricture    GERD (gastroesophageal reflux disease)    Muscle weakness    Musculoskeletal pain of lower extremity    Sleep disorder    Thyroid disease    Hashimoto disease    Family History: Family History  Problem Relation Age of Onset   Stroke Mother    Hypertension Mother    Chronic fatigue Mother        chr fatique immune dysfunction   Diabetes Father    Cancer Maternal Grandmother 25       ovarian or uterine -  not sure which   Cancer Maternal Grandfather        fam hx of leukemia   Diabetes Paternal Grandmother    Diabetes Paternal Grandfather     Social History: Social History   Socioeconomic History   Marital status: Married    Spouse name: Chrissie Noa   Number of children: 1   Years of education: 16   Highest education level: Not on file  Occupational History   Occupation: Runner, broadcasting/film/video  Tobacco Use   Smoking status: Never   Smokeless tobacco: Never  Vaping Use   Vaping Use: Never used  Substance and Sexual Activity   Alcohol use: No    Drug use: No   Sexual activity: Yes    Partners: Male    Birth control/protection: Post-menopausal  Other Topics Concern   Not on file  Social History Narrative   Not on file   Social Determinants of Health   Financial Resource Strain: Not on file  Food Insecurity: Not on file  Transportation Needs: Not on file  Physical Activity: Not on file  Stress: Not on file  Social Connections: Not on file  Intimate Partner Violence: Not on file    Vital Signs: Blood pressure 138/78, pulse (!) 104, temperature 97.8 F (36.6 C), resp. rate 16, height 5\' 9"  (1.753 m), weight 152 lb 6.4 oz (69.1 kg), SpO2 98 %.  Examination: General Appearance: The patient is well-developed, well-nourished, and in no distress. Skin: Gross inspection of skin unremarkable. Head: normocephalic, no gross deformities. Eyes: no gross deformities noted. ENT: ears appear grossly normal no exudates. Neck: Supple. No thyromegaly. No LAD. Respiratory: no rhonchi noted. Cardiovascular: Normal S1 and S2 without murmur or rub. Extremities: No cyanosis. pulses are equal. Neurologic: Alert and oriented. No involuntary movements.  LABS: No results found for this or any previous visit (from the past 2160 hour(s)).  Radiology: No results found.  No results found.  No results found.  Assessment and Plan: Patient Active Problem List   Diagnosis Date Noted   Hashimoto's disease 10/23/2021   Arthralgia of both knees 10/23/2021   Avitaminosis D 10/23/2021   Encounter for hepatitis C screening test for low risk patient 10/23/2021   Encounter for screening for HIV 10/23/2021   Screening for osteoporosis 10/23/2021   Encounter for screening mammogram for malignant neoplasm of breast 10/23/2021   Multiple thyroid nodules 10/23/2021   Primary hypertension 10/23/2021   Airway hyperreactivity 09/02/2015   Fibrositis 09/02/2015   Cannot sleep 09/02/2015   Disorder of thyroid 09/02/2015    1. Chronic asthma, mild  intermittent, uncomplicated This is actually under good control she has not had any admissions to the hospital she does not have frequent exacerbations either.  Will continue with current regimen as prescribed  2. Seasonal allergic rhinitis due to pollen Antihistamines as needed this can be over-the-counter she is under fairly good control  3. Esophageal web  - esomeprazole (NEXIUM) 40 MG capsule; Take 1 capsule (40 mg total) by mouth daily at 12 noon.  Dispense: 30 capsule; Refill: 4  4. Chronic diastolic heart failure (HCC) This is compensated and has not been a known issue basically  5. Gastroesophageal reflux disease without esophagitis Some of her symptoms may very well be due to GERD and so therefore I have given her a prescription for Nexium if her insurance covers it.  I did also recommend that we get a upper GI barium swallow modified if possible - esomeprazole (NEXIUM) 40 MG capsule; Take 1 capsule (  40 mg total) by mouth daily at 12 noon.  Dispense: 30 capsule; Refill: 4 - DG UGI W DOUBLE CM (HD BA); Future - salmeterol (SEREVENT DISKUS) 50 MCG/ACT diskus inhaler; Inhale 1 puff into the lungs 2 (two) times daily.  Dispense: 1 each; Refill: 12   General Counseling: I have discussed the findings of the evaluation and examination with Antonique.  I have also discussed any further diagnostic evaluation thatmay be needed or ordered today. [place] verbalizes understanding of the findings of todays visit. We also reviewed her medications today and discussed drug interactions and side effects including but not limited excessive drowsiness and altered mental states. We also discussed that there is always a risk not just to her but also people around her. she has been encouraged to call the office with any questions or concerns that should arise related to todays visit.  No orders of the defined types were placed in this encounter.    Time spent: 30  I have personally obtained a history, examined  the patient, evaluated laboratory and imaging results, formulated the assessment and plan and placed orders.    Yevonne Pax, MD [facility] Pulmonary and Critical Care Sleep medicine

## 2023-04-30 ENCOUNTER — Ambulatory Visit: Payer: BC Managed Care – PPO | Admitting: Urology

## 2023-05-13 LAB — PULMONARY FUNCTION TEST

## 2023-06-26 DIAGNOSIS — I10 Essential (primary) hypertension: Secondary | ICD-10-CM | POA: Diagnosis not present

## 2023-06-26 DIAGNOSIS — Z79899 Other long term (current) drug therapy: Secondary | ICD-10-CM | POA: Diagnosis not present

## 2023-06-26 DIAGNOSIS — J4522 Mild intermittent asthma with status asthmaticus: Secondary | ICD-10-CM | POA: Diagnosis not present

## 2023-06-26 DIAGNOSIS — E063 Autoimmune thyroiditis: Secondary | ICD-10-CM | POA: Diagnosis not present

## 2023-07-22 DIAGNOSIS — L814 Other melanin hyperpigmentation: Secondary | ICD-10-CM | POA: Diagnosis not present

## 2023-07-22 DIAGNOSIS — I8392 Asymptomatic varicose veins of left lower extremity: Secondary | ICD-10-CM | POA: Diagnosis not present

## 2023-07-22 DIAGNOSIS — L570 Actinic keratosis: Secondary | ICD-10-CM | POA: Diagnosis not present

## 2023-09-09 DIAGNOSIS — N3010 Interstitial cystitis (chronic) without hematuria: Secondary | ICD-10-CM | POA: Diagnosis not present

## 2023-09-11 ENCOUNTER — Telehealth: Payer: Self-pay | Admitting: Internal Medicine

## 2023-09-11 NOTE — Telephone Encounter (Signed)
Lvm & sent mychart msg to move 10/21/23 appointment-Toni

## 2023-10-21 ENCOUNTER — Ambulatory Visit: Payer: BC Managed Care – PPO | Admitting: Internal Medicine

## 2023-11-20 DIAGNOSIS — E042 Nontoxic multinodular goiter: Secondary | ICD-10-CM | POA: Diagnosis not present

## 2023-11-20 DIAGNOSIS — E063 Autoimmune thyroiditis: Secondary | ICD-10-CM | POA: Diagnosis not present

## 2023-11-25 ENCOUNTER — Telehealth: Payer: Self-pay | Admitting: Internal Medicine

## 2023-11-25 NOTE — Telephone Encounter (Signed)
 Patient called to cancel appt and reschedule after her CAT scan forward to [facility]

## 2023-12-02 ENCOUNTER — Ambulatory Visit: Payer: BC Managed Care – PPO | Admitting: Internal Medicine

## 2023-12-16 ENCOUNTER — Encounter: Payer: Self-pay | Admitting: Internal Medicine

## 2023-12-16 ENCOUNTER — Ambulatory Visit: Payer: BC Managed Care – PPO | Admitting: Internal Medicine

## 2023-12-16 VITALS — BP 150/62 | HR 85 | Temp 98.6°F | Resp 16 | Ht 69.0 in | Wt 157.2 lb

## 2023-12-16 DIAGNOSIS — R079 Chest pain, unspecified: Secondary | ICD-10-CM

## 2023-12-16 DIAGNOSIS — J4520 Mild intermittent asthma, uncomplicated: Secondary | ICD-10-CM

## 2023-12-16 DIAGNOSIS — J301 Allergic rhinitis due to pollen: Secondary | ICD-10-CM

## 2023-12-16 DIAGNOSIS — R0602 Shortness of breath: Secondary | ICD-10-CM

## 2023-12-16 NOTE — Progress Notes (Signed)
 [facility] [address] [place], [place] [address]  Pulmonary Sleep Medicine   Office Visit Note  Patient Name: Lisa Robbins DOB: [birth year] MRN [id number]  Date of Service: 12/16/2023  Complaints/HPI: She states she has been experiencing shortness of breath. She states sometimes her heart rate is up. She feels shortness of breath going up stairs also. She has had a previous extensive workup. I had ordered a CT chest and also a UGI and it appears she did not have this done yet. Apparently she may have some issues with insurance now and was concerned if pre-existing conditions would be covered. She wants to find out about this and wants to hold off on scheduling anything. She states she is also supposed to see a GI doctor at [facility] Spirometry Results: Peak Flow: (!) 4 L/min FEV1: 2.34 liters FVC: 2.71 liters FEV1/FVC: 86.3 % FVC  % Predicted: 71 % FeF 25-75: 3.01 liters FeF 25-75 % Predicted: 122   ROS  General: (-) fever, (-) chills, (-) night sweats, (-) weakness Skin: (-) rashes, (-) itching,. Eyes: (-) visual changes, (-) redness, (-) itching. Nose and Sinuses: (-) nasal stuffiness or itchiness, (-) postnasal drip, (-) nosebleeds, (-) sinus trouble. Mouth and Throat: (-) sore throat, (-) hoarseness. Neck: (-) swollen glands, (-) enlarged thyroid, (-) neck pain. Respiratory: - cough, (-) bloody sputum, + shortness of breath, - wheezing. Cardiovascular: - ankle swelling, (-) chest pain. Lymphatic: (-) lymph node enlargement. Neurologic: (-) numbness, (-) tingling. Psychiatric: (-) anxiety, (-) depression   Current Medication: Outpatient Encounter Medications as of 12/16/2023  Medication Sig   albuterol (VENTOLIN HFA) 108 (90 Base) MCG/ACT inhaler TAKE 2 PUFFS BY MOUTH EVERY 6 HOURS AS NEEDED FOR WHEEZE OR SHORTNESS OF BREATH   amitriptyline (ELAVIL) 25 MG tablet Take 5 tablets (125 mg total) by mouth at bedtime. Up to 4 tabs q hs prn   esomeprazole  (NEXIUM) 40 MG capsule Take 1 capsule (40 mg total) by mouth daily at 12 noon.   furosemide (LASIX) 20 MG tablet TAKE 1 TABLET (20 MG TOTAL) BY MOUTH DAILY AS NEEDED (SHORTNESS OF BREATH).   Glucosamine HCl-MSM (MSM GLUCOSAMINE) 375-375 MG CAPS Take by mouth.   LORazepam (ATIVAN) 0.5 MG tablet Take 1 tablet (0.5 mg total) by mouth daily as needed for anxiety.   Multiple Vitamin (MULTIVITAMIN) capsule Take 1 capsule by mouth daily.   Probiotic Product (PRO-BIOTIC BLEND PO) Take by mouth.   salmeterol (SEREVENT DISKUS) 50 MCG/ACT diskus inhaler Inhale 1 puff into the lungs 2 (two) times daily.   SEREVENT DISKUS 50 MCG/ACT diskus inhaler INHALE 1 PUFF INTO THE LUNGS TWICE A DAY   thyroid (ARMOUR) 60 MG tablet Take by mouth.   Turmeric (QC TUMERIC COMPLEX PO) Take by mouth. For inflammation   Vitamin D3 (VITAMIN D) 25 MCG tablet Take 1,000 Units by mouth daily.   No facility-administered encounter medications on file as of 12/16/2023.    Surgical History: Past Surgical History:  Procedure Laterality Date   DIAGNOSTIC LAPAROSCOPY  01/18/1997   LLQ PAIN, DYSMENORRHEA. AUB   DILATION AND CURETTAGE OF UTERUS  01/18/1997   ABD PAIN   TONSILLECTOMY     UPPER GI ENDOSCOPY  05/11/91; 10/20/91; 10/28/91   WITH BALOON DILATION   URETHRAL DILATION  2007    Medical History: Past Medical History:  Diagnosis Date   Asthma    Esophageal stricture    GERD (gastroesophageal reflux disease)    Muscle weakness  Musculoskeletal pain of lower extremity    Sleep disorder    Thyroid disease    Hashimoto disease    Family History: Family History  Problem Relation Age of Onset   Stroke Mother    Hypertension Mother    Chronic fatigue Mother        chr fatique immune dysfunction   Diabetes Father    Cancer Maternal Grandmother 64       ovarian or uterine - not sure which   Cancer Maternal Grandfather        fam hx of leukemia   Diabetes Paternal Grandmother    Diabetes Paternal Grandfather      Social History: Social History   Socioeconomic History   Marital status: Married    Spouse name: Chrissie Noa   Number of children: 1   Years of education: 16   Highest education level: Not on file  Occupational History   Occupation: Runner, broadcasting/film/video  Tobacco Use   Smoking status: Never   Smokeless tobacco: Never  Vaping Use   Vaping status: Never Used  Substance and Sexual Activity   Alcohol use: No   Drug use: No   Sexual activity: Yes    Partners: Male    Birth control/protection: Post-menopausal  Other Topics Concern   Not on file  Social History Narrative   Not on file   Social Drivers of Health   Financial Resource Strain: Low Risk  (11/20/2023)   Received from [facility]   Overall Financial Resource Strain (CARDIA)    Difficulty of Paying Living Expenses: Not hard at all  Food Insecurity: No Food Insecurity (11/20/2023)   Received from [facility]   Hunger Vital Sign    Ran Out of Food in the Last Year: Never true    Worried About Running Out of Food in the Last Year: Never true  Transportation Needs: No Transportation Needs (11/20/2023)   Received from [facility] - Transportation    In the past 12 months, has lack of transportation kept you from medical appointments or from getting medications?: No    Lack of Transportation (Non-Medical): No  Physical Activity: Not on file  Stress: Not on file  Social Connections: Not on file  Intimate Partner Violence: Not on file    Vital Signs: Blood pressure (!) 150/62, pulse 85, temperature 98.6 F (37 C), resp. rate 16, height 5\' 9"  (1.753 m), weight 157 lb 3.2 oz (71.3 kg), SpO2 98%, peak flow (!) 4 L/min.  Examination: General Appearance: The patient is well-developed, well-nourished, and in no distress. Skin: Gross inspection of skin unremarkable. Head: normocephalic, no gross deformities. Eyes: no gross deformities noted. ENT: ears appear grossly normal no  exudates. Neck: Supple. No thyromegaly. No LAD. Respiratory: no rhonchi noted at this time. Cardiovascular: Normal S1 and S2 without murmur or rub. Extremities: No cyanosis. pulses are equal. Neurologic: Alert and oriented. No involuntary movements.  LABS: No results found for this or any previous visit (from the past 2160 hours).  Radiology: No results found.  No results found.  No results found.  Assessment and Plan: Patient Active Problem List   Diagnosis Date Noted   Hashimoto's disease 10/23/2021   Arthralgia of both knees 10/23/2021   Avitaminosis D 10/23/2021   Encounter for hepatitis C screening test for low risk patient 10/23/2021   Encounter for screening for HIV 10/23/2021   Screening for osteoporosis 10/23/2021   Encounter for screening mammogram for malignant  neoplasm of breast 10/23/2021   Multiple thyroid nodules 10/23/2021   Primary hypertension 10/23/2021   Airway hyperreactivity 09/02/2015   Fibrositis 09/02/2015   Cannot sleep 09/02/2015   Disorder of thyroid 09/02/2015    1. SOB (shortness of breath) (Primary) She still has symptoms although her spiro has been good - Spirometry with Graph - CT Angio Chest W/Cm &/Or Wo Cm; Future  2. Chronic asthma, mild intermittent, uncomplicated Overall under control ?anxiety playing a role in some of her symptoms  3. Seasonal allergic rhinitis due to pollen Antihistamines as needed  4. Chest pain, unspecified type Has vague tightness unlikly cardiopulmomary in nature has had extensive workup done    General Counseling: I have discussed the findings of the evaluation and examination with Lisa Robbins.  I have also discussed any further diagnostic evaluation thatmay be needed or ordered today. Lisa Robbins verbalizes understanding of the findings of todays visit. We also reviewed her medications today and discussed drug interactions and side effects including but not limited excessive drowsiness and altered mental states. We  also discussed that there is always a risk not just to her but also people around her. she has been encouraged to call the office with any questions or concerns that should arise related to todays visit.  Orders Placed This Encounter  Procedures   Spirometry with Graph    Where should this test be performed?:   [facility]     Time spent: 89  I have personally obtained a history, examined the patient, evaluated laboratory and imaging results, formulated the assessment and plan and placed orders.    Yevonne Pax, MD [facility] Pulmonary and Critical Care Sleep medicine

## 2023-12-16 NOTE — Patient Instructions (Signed)
 Shortness of Breath, Adult Shortness of breath means you have trouble breathing. Shortness of breath could be a sign of a medical problem. Follow these instructions at home:  Pollution Do not smoke or use any products that contain nicotine or tobacco. If you need help quitting, ask your doctor. Avoid things that can make it harder to breathe, such as: Smoke of all kinds. This includes smoke from campfires or forest fires. Do not smoke or allow others to smoke in your home. Mold. Dust. Air pollution. Chemical smells. Things that can give you an allergic reaction (allergens) if you have allergies. Keep your living space clean. Use products that help remove mold and dust. General instructions Watch for any changes in your symptoms. Take over-the-counter and prescription medicines only as told by your doctor. This includes oxygen therapy and inhaled medicines. Rest as needed. Return to your normal activities when your doctor says that it is safe. Keep all follow-up visits. Contact a doctor if: Your condition does not get better as soon as expected. You have a hard time doing your normal activities, even after you rest. You have new symptoms. You cannot walk up stairs. You cannot exercise the way you normally do. Get help right away if: Your shortness of breath gets worse. You have trouble breathing when you are resting. You feel light-headed or you faint. You have a cough that is not helped by medicines. You cough up blood. You have pain with breathing. You have pain in your chest, arms, shoulders, or belly (abdomen). You have a fever. These symptoms may be an emergency. Get help right away. Call 911. Do not wait to see if the symptoms will go away. Do not drive yourself to the hospital. Summary Shortness of breath is when you have trouble breathing enough air. It can be a sign of a medical problem. Avoid things that make it hard for you to breathe, such as smoking, pollution,  mold, and dust. Watch for any changes in your symptoms. Contact your doctor if you do not get better or you get worse. This information is not intended to replace advice given to you by your health care provider. Make sure you discuss any questions you have with your health care provider. Document Revised: 06/17/2021 Document Reviewed: 06/17/2021 Elsevier Patient Education  2024 [employer].

## 2023-12-25 ENCOUNTER — Ambulatory Visit
Admission: RE | Admit: 2023-12-25 | Discharge: 2023-12-25 | Disposition: A | Discharge status: Home / Self Care | Payer: BC Managed Care – PPO | Source: Ambulatory Visit | Attending: Internal Medicine | Admitting: Internal Medicine

## 2023-12-25 DIAGNOSIS — R0602 Shortness of breath: Secondary | ICD-10-CM

## 2023-12-25 DIAGNOSIS — I700 Atherosclerosis of aorta: Secondary | ICD-10-CM | POA: Diagnosis not present

## 2023-12-25 DIAGNOSIS — R0600 Dyspnea, unspecified: Secondary | ICD-10-CM | POA: Diagnosis not present

## 2023-12-25 MED ORDER — IOPAMIDOL (ISOVUE-370) INJECTION 76%
75.0000 mL | Freq: Once | INTRAVENOUS | Status: AC | PRN
  Administered 2023-12-25: 75 mL via INTRAVENOUS

## 2023-12-31 ENCOUNTER — Encounter: Payer: Self-pay | Admitting: Internal Medicine

## 2023-12-31 ENCOUNTER — Telehealth: Payer: Self-pay | Admitting: Internal Medicine

## 2023-12-31 ENCOUNTER — Ambulatory Visit: Payer: BC Managed Care – PPO | Admitting: Internal Medicine

## 2023-12-31 VITALS — BP 135/90 | HR 83 | Temp 98.3°F | Resp 16 | Ht 69.0 in | Wt 157.0 lb

## 2023-12-31 DIAGNOSIS — K219 Gastro-esophageal reflux disease without esophagitis: Secondary | ICD-10-CM

## 2023-12-31 DIAGNOSIS — J4520 Mild intermittent asthma, uncomplicated: Secondary | ICD-10-CM | POA: Diagnosis not present

## 2023-12-31 DIAGNOSIS — R079 Chest pain, unspecified: Secondary | ICD-10-CM

## 2023-12-31 NOTE — Patient Instructions (Signed)
Chest Wall Pain Chest wall pain is pain in or around the bones and muscles of your chest. Chest wall pain may be caused by: An injury. Coughing a lot. Using your chest and arm muscles too much. Sometimes, the cause may not be known. This pain may take a few [place] or longer to get better. Follow these instructions at home: Managing pain, stiffness, and swelling If told, put ice on the painful area: Put ice in a plastic bag. Place a towel between your skin and the bag. Leave the ice on for 20 minutes, 2-3 times a day.  Activity Rest as told by your doctor. Avoid doing things that cause pain. This includes lifting heavy items. Ask your doctor what activities are safe for you. General instructions  Take over-the-counter and prescription medicines only as told by your doctor. Do not use any products that contain nicotine or tobacco, such as cigarettes, e-cigarettes, and chewing tobacco. If you need help quitting, ask your doctor. Keep all follow-up visits as told by your doctor. This is important. Contact a doctor if: You have a fever. Your chest pain gets worse. You have new symptoms. Get help right away if: You feel sick to your stomach (nauseous) or you throw up (vomit). You feel sweaty or light-headed. You have a cough with mucus from your lungs (sputum) or you cough up blood. You are short of breath. These symptoms may be an emergency. Do not wait to see if the symptoms will go away. Get medical help right away. Call your local emergency services (911 in the [place] Do not drive yourself to the hospital. Summary Chest wall pain is pain in or around the bones and muscles of your chest. It may be treated with ice, rest, and medicines. Your condition may also get better if you avoid doing things that cause pain. Contact a doctor if you have a fever, chest pain that gets worse, or new symptoms. Get help right away if you feel light-headed or you get short of breath. These symptoms may  be an emergency. This information is not intended to replace advice given to you by your health care provider. Make sure you discuss any questions you have with your health care provider. Document Revised: 10/22/2022 Document Reviewed: 10/22/2022 Elsevier Patient Education  2024 [employer].

## 2023-12-31 NOTE — Progress Notes (Signed)
 [facility] [address] [place], [place] [address]  Pulmonary Sleep Medicine   Office Visit Note  Patient Name: Lisa Robbins DOB: 08-05-1961 MRN [id number]  Date of Service: 12/31/2023  Complaints/HPI: She had a CT chest done and this does not show PE. She had some areas of CAD and plaques noted. She still has chest pain and she has shortness of breath noted. She has not had a recent stress test and has not had a cath done. I will refer to Cards for evaluation.  Office Spirometry Results:     ROS  General: (-) fever, (-) chills, (-) night sweats, (-) weakness Skin: (-) rashes, (-) itching,. Eyes: (-) visual changes, (-) redness, (-) itching. Nose and Sinuses: (-) nasal stuffiness or itchiness, (-) postnasal drip, (-) nosebleeds, (-) sinus trouble. Mouth and Throat: (-) sore throat, (-) hoarseness. Neck: (-) swollen glands, (-) enlarged thyroid, (-) neck pain. Respiratory: + cough, (-) bloody sputum, + shortness of breath, - wheezing. Cardiovascular: - ankle swelling, (-) chest pain. Lymphatic: (-) lymph node enlargement. Neurologic: (-) numbness, (-) tingling. Psychiatric: (-) anxiety, (-) depression   Current Medication: Outpatient Encounter Medications as of 12/31/2023  Medication Sig   albuterol (VENTOLIN HFA) 108 (90 Base) MCG/ACT inhaler TAKE 2 PUFFS BY MOUTH EVERY 6 HOURS AS NEEDED FOR WHEEZE OR SHORTNESS OF BREATH   amitriptyline (ELAVIL) 25 MG tablet Take 5 tablets (125 mg total) by mouth at bedtime. Up to 4 tabs q hs prn   esomeprazole (NEXIUM) 40 MG capsule Take 1 capsule (40 mg total) by mouth daily at 12 noon.   furosemide (LASIX) 20 MG tablet TAKE 1 TABLET (20 MG TOTAL) BY MOUTH DAILY AS NEEDED (SHORTNESS OF BREATH).   Glucosamine HCl-MSM (MSM GLUCOSAMINE) 375-375 MG CAPS Take by mouth.   LORazepam (ATIVAN) 0.5 MG tablet Take 1 tablet (0.5 mg total) by mouth daily as needed for anxiety.   Multiple Vitamin (MULTIVITAMIN) capsule Take 1 capsule by  mouth daily.   Probiotic Product (PRO-BIOTIC BLEND PO) Take by mouth.   salmeterol (SEREVENT DISKUS) 50 MCG/ACT diskus inhaler Inhale 1 puff into the lungs 2 (two) times daily.   SEREVENT DISKUS 50 MCG/ACT diskus inhaler INHALE 1 PUFF INTO THE LUNGS TWICE A DAY   thyroid (ARMOUR) 60 MG tablet Take by mouth.   Turmeric (QC TUMERIC COMPLEX PO) Take by mouth. For inflammation   Vitamin D3 (VITAMIN D) 25 MCG tablet Take 1,000 Units by mouth daily.   No facility-administered encounter medications on file as of 12/31/2023.    Surgical History: Past Surgical History:  Procedure Laterality Date   DIAGNOSTIC LAPAROSCOPY  01/18/1997   LLQ PAIN, DYSMENORRHEA. AUB   DILATION AND CURETTAGE OF UTERUS  01/18/1997   ABD PAIN   TONSILLECTOMY     UPPER GI ENDOSCOPY  05/11/91; 10/20/91; 10/28/91   WITH BALOON DILATION   URETHRAL DILATION  2007    Medical History: Past Medical History:  Diagnosis Date   Asthma    Esophageal stricture    GERD (gastroesophageal reflux disease)    Muscle weakness    Musculoskeletal pain of lower extremity    Sleep disorder    Thyroid disease    Hashimoto disease    Family History: Family History  Problem Relation Age of Onset   Stroke Mother    Hypertension Mother    Chronic fatigue Mother        chr fatique immune dysfunction   Diabetes Father    Cancer Maternal Grandmother 73  ovarian or uterine - not sure which   Cancer Maternal Grandfather        fam hx of leukemia   Diabetes Paternal Grandmother    Diabetes Paternal Grandfather     Social History: Social History   Socioeconomic History   Marital status: Married    Spouse name: Chrissie Noa   Number of children: 1   Years of education: 16   Highest education level: Not on file  Occupational History   Occupation: Runner, broadcasting/film/video  Tobacco Use   Smoking status: Never   Smokeless tobacco: Never  Vaping Use   Vaping status: Never Used  Substance and Sexual Activity   Alcohol use: No   Drug use: No    Sexual activity: Yes    Partners: Male    Birth control/protection: Post-menopausal  Other Topics Concern   Not on file  Social History Narrative   Not on file   Social Drivers of Health   Financial Resource Strain: Low Risk  (11/20/2023)   Received from [facility]   Overall Financial Resource Strain (CARDIA)    Difficulty of Paying Living Expenses: Not hard at all  Food Insecurity: No Food Insecurity (11/20/2023)   Received from [facility]   Hunger Vital Sign    Ran Out of Food in the Last Year: Never true    Worried About Running Out of Food in the Last Year: Never true  Transportation Needs: No Transportation Needs (11/20/2023)   Received from [facility] - Transportation    In the past 12 months, has lack of transportation kept you from medical appointments or from getting medications?: No    Lack of Transportation (Non-Medical): No  Physical Activity: Not on file  Stress: Not on file  Social Connections: Not on file  Intimate Partner Violence: Not on file    Vital Signs: Blood pressure (!) 135/90, pulse 83, temperature 98.3 F (36.8 C), resp. rate 16, height 5\' 9"  (1.753 m), weight 157 lb (71.2 kg), SpO2 98%.  Examination: General Appearance: The patient is well-developed, well-nourished, and in no distress. Skin: Gross inspection of skin unremarkable. Head: normocephalic, no gross deformities. Eyes: no gross deformities noted. ENT: ears appear grossly normal no exudates. Neck: Supple. No thyromegaly. No LAD. Respiratory: no rhonch inoted. Cardiovascular: Normal S1 and S2 without murmur or rub. Extremities: No cyanosis. pulses are equal. Neurologic: Alert and oriented. No involuntary movements.  LABS: No results found for this or any previous visit (from the past 2160 hours).  Radiology: CT Angio Chest W/Cm &/Or Wo Cm Result Date: 12/25/2023 CLINICAL DATA:  Dyspnea, chronic, chest wall or pleura  disease suspected EXAM: CT ANGIOGRAPHY CHEST WITH CONTRAST TECHNIQUE: Multidetector CT imaging of the chest was performed using the standard protocol during bolus administration of intravenous contrast. Multiplanar CT image reconstructions and MIPs were obtained to evaluate the vascular anatomy. RADIATION DOSE REDUCTION: This exam was performed according to the departmental dose-optimization program which includes automated exposure control, adjustment of the mA and/or kV according to patient size and/or use of iterative reconstruction technique. CONTRAST:  75mL ISOVUE-370 IOPAMIDOL (ISOVUE-370) INJECTION 76% COMPARISON:  CT 01/05/2013 FINDINGS: Cardiovascular: Satisfactory opacification of the pulmonary arteries to the segmental level. No evidence of pulmonary embolism. Thoracic aorta is nonaneurysmal. Scattered atherosclerotic vascular calcifications of the aorta and coronary arteries. Normal heart size. No pericardial effusion. Mediastinum/Nodes: No enlarged mediastinal, hilar, or axillary lymph nodes. Thyroid gland, trachea, and esophagus demonstrate no significant findings. Lungs/Pleura: Lungs  are clear. No pleural effusion or pneumothorax. Upper Abdomen: No acute abnormality. Musculoskeletal: No chest wall abnormality. No acute or significant osseous findings. Review of the MIP images confirms the above findings. IMPRESSION: 1. No evidence of pulmonary embolism or other acute intrathoracic findings. 2. Aortic and coronary artery atherosclerosis (ICD10-I70.0). Electronically Signed   By: Duanne Guess D.O.   On: 12/25/2023 10:58    No results found.  CT Angio Chest W/Cm &/Or Wo Cm Result Date: 12/25/2023 CLINICAL DATA:  Dyspnea, chronic, chest wall or pleura disease suspected EXAM: CT ANGIOGRAPHY CHEST WITH CONTRAST TECHNIQUE: Multidetector CT imaging of the chest was performed using the standard protocol during bolus administration of intravenous contrast. Multiplanar CT image reconstructions and MIPs  were obtained to evaluate the vascular anatomy. RADIATION DOSE REDUCTION: This exam was performed according to the departmental dose-optimization program which includes automated exposure control, adjustment of the mA and/or kV according to patient size and/or use of iterative reconstruction technique. CONTRAST:  75mL ISOVUE-370 IOPAMIDOL (ISOVUE-370) INJECTION 76% COMPARISON:  CT 01/05/2013 FINDINGS: Cardiovascular: Satisfactory opacification of the pulmonary arteries to the segmental level. No evidence of pulmonary embolism. Thoracic aorta is nonaneurysmal. Scattered atherosclerotic vascular calcifications of the aorta and coronary arteries. Normal heart size. No pericardial effusion. Mediastinum/Nodes: No enlarged mediastinal, hilar, or axillary lymph nodes. Thyroid gland, trachea, and esophagus demonstrate no significant findings. Lungs/Pleura: Lungs are clear. No pleural effusion or pneumothorax. Upper Abdomen: No acute abnormality. Musculoskeletal: No chest wall abnormality. No acute or significant osseous findings. Review of the MIP images confirms the above findings. IMPRESSION: 1. No evidence of pulmonary embolism or other acute intrathoracic findings. 2. Aortic and coronary artery atherosclerosis (ICD10-I70.0). Electronically Signed   By: Duanne Guess D.O.   On: 12/25/2023 10:58    Assessment and Plan: Patient Active Problem List   Diagnosis Date Noted   Hashimoto's disease 10/23/2021   Arthralgia of both knees 10/23/2021   Avitaminosis D 10/23/2021   Encounter for hepatitis C screening test for low risk patient 10/23/2021   Encounter for screening for HIV 10/23/2021   Screening for osteoporosis 10/23/2021   Encounter for screening mammogram for malignant neoplasm of breast 10/23/2021   Multiple thyroid nodules 10/23/2021   Primary hypertension 10/23/2021   Airway hyperreactivity 09/02/2015   Fibrositis 09/02/2015   Cannot sleep 09/02/2015   Disorder of thyroid 09/02/2015    1.  Chest pain, unspecified type (Primary) Appears to be not related to the lungs I would defer to cardiology for further evaluation - Ambulatory referral to Cardiology  2. Chronic asthma, mild intermittent, uncomplicated Under good control do not see any reason to think she is having frequenbt flareups  3. Gastroesophageal reflux disease without esophagitis May also be contributing to the pain she experiences may benefir from ongoing PPI usage   General Counseling: I have discussed the findings of the evaluation and examination with Nellie.  I have also discussed any further diagnostic evaluation thatmay be needed or ordered today. Coila verbalizes understanding of the findings of todays visit. We also reviewed her medications today and discussed drug interactions and side effects including but not limited excessive drowsiness and altered mental states. We also discussed that there is always a risk not just to her but also people around her. she has been encouraged to call the office with any questions or concerns that should arise related to todays visit.  No orders of the defined types were placed in this encounter.    Time spent: 39  I have personally  obtained a history, examined the patient, evaluated laboratory and imaging results, formulated the assessment and plan and placed orders.    Yevonne Pax, MD [facility] Pulmonary and Critical Care Sleep medicine

## 2023-12-31 NOTE — Telephone Encounter (Signed)
 Patient will call back to schedule 1 yr fu w/ DSK

## 2024-01-03 NOTE — Progress Notes (Signed)
 Cardiology Office Note    Date:  01/06/2024   ID:  Kritika, Stukes 1961/10/22, MRN [id number]  PCP:  Bosie Clos, MD  Cardiologist:  Julien Nordmann, MD  Electrophysiologist:  None   Chief Complaint: Chest tightness and exertional dyspnea  History of Present Illness:   Lisa Robbins is a 63 y.o. female with history of coronary artery calcification noted on CT imaging, HTN, HLD, aortic atherosclerosis, asthma, dilated aortic root and ascending aorta, esophageal stricture, hypothyroidism, and GERD who presents for evaluation of chest tightness and exertional dyspnea.  Echo performed through outside office in 10/2021 showed an EF greater than 55%, normal wall motion, diastolic dysfunction, normal RV cavity size, mild mitral regurgitation, and mildly dilated aortic root and ascending aorta (3.8 cm and 4 cm respectively).  She was initially and last seen in our office in 05/2022 for evaluation of shortness of breath and palpitations.  At that time it was noted that she has significant shortness of breath over the preceding year that was worse in the heat.  She reported her inhaler helped.  She also reported a "strange cough."  EKG showed sinus rhythm with rare PVC and no significant ST-T changes.  It was recommended she undergo a trial of furosemide 20 mg daily as needed for shortness of breath.  She was referred to pulmonary rehab.  She was referred back to PCP and pulmonology for ongoing management.  In follow-up with pulmonology, she has continued to note shortness of breath as well as some chest pain more recently.  CTA chest on 12/25/2023 showed no evidence of PE or other acute intrathoracic findings.  Coronary artery calcification and aortic atherosclerosis was noted.  In this setting, she was referred to cardiology for evaluation of shortness of breath and chest pain.  She comes in today continuing to note chest tightness and exertional dyspnea that have been persistent since prior  to her evaluation in 05/2022.  She reports a chest tightness that is associated with exertion or if talking for extended time frames.  However, symptoms can also randomly occur at rest.  She also reports some exertional fatigue and a long history of palpitations that at times will occur every 3 beats.  She reports pedal edema if up standing and conversing for extended time frames.  Blood pressure well-controlled in the 120s systolic at home.  No frank syncope or progressive orthopnea.  She never took as needed furosemide that was prescribed at her visit in 2023.  Currently without symptoms of angina or cardiac decompensation.   Labs independently reviewed: 11/2023 - TSH normal 06/2023 - potassium 4.6, BUN 12, serum creatinine 0.8, albumin 4.6, AST/ALT normal, magnesium 2.5 11/2021 - Hgb 11.9, PLT 319 09/2019 - TC 191, TG 41, HDL 74, LDL 117  Past Medical History:  Diagnosis Date   Aortic atherosclerosis (HCC)    Ascending aorta dilatation (HCC)    Asthma    CAD (coronary artery disease)    Dilated aortic root (HCC)    Esophageal stricture    GERD (gastroesophageal reflux disease)    Hyperlipemia    Muscle weakness    Musculoskeletal pain of lower extremity    Sleep disorder    Thyroid disease    Hashimoto disease    Past Surgical History:  Procedure Laterality Date   DIAGNOSTIC LAPAROSCOPY  01/18/1997   LLQ PAIN, DYSMENORRHEA. AUB   DILATION AND CURETTAGE OF UTERUS  01/18/1997   ABD PAIN   TONSILLECTOMY  UPPER GI ENDOSCOPY  05/11/91; 10/20/91; 10/28/91   WITH BALOON DILATION   URETHRAL DILATION  2007    Current Medications: Current Meds  Medication Sig   albuterol (VENTOLIN HFA) 108 (90 Base) MCG/ACT inhaler TAKE 2 PUFFS BY MOUTH EVERY 6 HOURS AS NEEDED FOR WHEEZE OR SHORTNESS OF BREATH   amitriptyline (ELAVIL) 25 MG tablet Take 5 tablets (125 mg total) by mouth at bedtime. Up to 4 tabs q hs prn   esomeprazole (NEXIUM) 40 MG capsule Take 1 capsule (40 mg total) by mouth daily  at 12 noon.   furosemide (LASIX) 20 MG tablet TAKE 1 TABLET (20 MG TOTAL) BY MOUTH DAILY AS NEEDED (SHORTNESS OF BREATH).   Glucosamine HCl-MSM (MSM GLUCOSAMINE) 375-375 MG CAPS Take by mouth.   LORazepam (ATIVAN) 0.5 MG tablet Take 1 tablet (0.5 mg total) by mouth daily as needed for anxiety.   metoprolol tartrate (LOPRESSOR) 100 MG tablet Take 1 tablet (100 mg total) by mouth once for 1 dose. About 2 hours prior to your CTA   Multiple Vitamin (MULTIVITAMIN) capsule Take 1 capsule by mouth daily.   Probiotic Product (PRO-BIOTIC BLEND PO) Take by mouth.   SEREVENT DISKUS 50 MCG/ACT diskus inhaler INHALE 1 PUFF INTO THE LUNGS TWICE A DAY   thyroid (ARMOUR) 60 MG tablet Take by mouth.   Turmeric (QC TUMERIC COMPLEX PO) Take by mouth. For inflammation   Vitamin D3 (VITAMIN D) 25 MCG tablet Take 1,000 Units by mouth daily.    Allergies:   Dust mite extract   Social History   Socioeconomic History   Marital status: Married    Spouse name: Chrissie Noa   Number of children: 1   Years of education: 16   Highest education level: Not on file  Occupational History   Occupation: Runner, broadcasting/film/video  Tobacco Use   Smoking status: Never   Smokeless tobacco: Never  Vaping Use   Vaping status: Never Used  Substance and Sexual Activity   Alcohol use: No   Drug use: No   Sexual activity: Yes    Partners: Male    Birth control/protection: Post-menopausal  Other Topics Concern   Not on file  Social History Narrative   Not on file   Social Drivers of Health   Financial Resource Strain: Low Risk  (11/20/2023)   Received from [facility]   Overall Financial Resource Strain (CARDIA)    Difficulty of Paying Living Expenses: Not hard at all  Food Insecurity: No Food Insecurity (11/20/2023)   Received from [facility]   Hunger Vital Sign    Ran Out of Food in the Last Year: Never true    Worried About Running Out of Food in the Last Year: Never true  Transportation Needs: No  Transportation Needs (11/20/2023)   Received from [facility] - Transportation    In the past 12 months, has lack of transportation kept you from medical appointments or from getting medications?: No    Lack of Transportation (Non-Medical): No  Physical Activity: Not on file  Stress: Not on file  Social Connections: Not on file     Family History:  The patient's family history includes Cancer in her maternal grandfather; Cancer (age of onset: 15) in her maternal grandmother; Chronic fatigue in her mother; Diabetes in her father, paternal grandfather, and paternal grandmother; Hypertension in her mother; Stroke in her mother.  ROS:   12-point review of systems is negative unless otherwise noted in  the HPI.   EKGs/Labs/Other Studies Reviewed:    Studies reviewed were summarized above. The additional studies were reviewed today:  2D echo 10/11/2021 [facility]): EF greater than 55%, no regional wall motion normalities, diastolic dysfunction, normal RV cavity size, mild mitral regurgitation, mild dilatation of the aortic root and ascending aorta   EKG:  EKG is ordered today.  The EKG ordered today demonstrates NSR, 90 bpm, rare isolated PVC, no acute ST-T changes.  Rhythm strip with sinus rhythm with no ventricular ectopy.  Recent Labs: No results found for requested labs within last 365 days.  Recent Lipid Panel    Component Value Date/Time   CHOL 197 10/31/2012 0000   TRIG 40 10/31/2012 0000   HDL 98 (A) 10/31/2012 0000   LDLCALC 91 10/31/2012 0000    PHYSICAL EXAM:    VS:  BP (!) 148/80 (BP Location: Left Arm, Patient Position: Sitting, Cuff Size: Normal)   Pulse 90   Ht 5\' 9"  (1.753 m)   Wt 154 lb 6 oz (70 kg)   SpO2 97%   BMI 22.80 kg/m   BMI: Body mass index is 22.8 kg/m.  Physical Exam Vitals reviewed.  Constitutional:      Appearance: She is well-developed.  HENT:     Head: Normocephalic and atraumatic.  Eyes:     General:        Right  eye: No discharge.        Left eye: No discharge.  Cardiovascular:     Rate and Rhythm: Normal rate and regular rhythm. Extrasystoles are present.    Pulses:          Posterior tibial pulses are 2+ on the right side and 2+ on the left side.     Heart sounds: Normal heart sounds, S1 normal and S2 normal. Heart sounds not distant. No midsystolic click and no opening snap. No murmur heard.    No friction rub.  Pulmonary:     Effort: Pulmonary effort is normal. No respiratory distress.     Breath sounds: Normal breath sounds. No decreased breath sounds, wheezing, rhonchi or rales.  Chest:     Chest wall: No tenderness.  Musculoskeletal:     Cervical back: Normal range of motion.     Right lower leg: No edema.     Left lower leg: No edema.  Skin:    General: Skin is warm and dry.     Nails: There is no clubbing.  Neurological:     Mental Status: She is alert and oriented to person, place, and time.  Psychiatric:        Speech: Speech normal.        Behavior: Behavior normal.        Thought Content: Thought content normal.        Judgment: Judgment normal.     Wt Readings from Last 3 Encounters:  01/06/24 154 lb 6 oz (70 kg)  12/31/23 157 lb (71.2 kg)  12/16/23 157 lb 3.2 oz (71.3 kg)     ASSESSMENT & PLAN:   CAD involving the native coronary arteries with precordial pain: Currently without symptoms of angina or cardiac decompensation.  Schedule coronary CTA.  Has self initiated aspirin 81 mg daily, continue.  Obtain lipid panel for further risk stratification.  Chronic dyspnea with underlying asthma and diastolic dysfunction: Obtain echo to evaluate for new cardiomyopathy, assess valvular function, and estimate RVSP.  She does not appear volume overloaded.  Follow-up with pulmonology as directed.  Obtain  CBC.  HTN: Blood pressure is mildly elevated in the office today, well-controlled at home.  Not currently requiring antihypertensive therapy.  Follow-up with PCP.  Aortic  atherosclerosis/HLD: LDL 117 in 2020.  Target LDL less than 70 based on aortic atherosclerosis and coronary artery calcification noted on CT imaging.  Obtain CMP, lipid panel, and direct LDL.  Escalate lipid-lowering therapy as indicated.  Dilated aortic root and ascending aorta: Measuring 3.8 cm and 4 cm respectively by echo in 2022.  Thoracic aorta documented to be nonaneurysmal on CTA chest earlier this month.  Palpitations: Isolated PVC noted on twelve-lead EKG.  Place Zio patch.  Recent TSH normal.  Check CBC and CMP.    Disposition: F/u with Dr. Mariah Milling in 3 months to reestablish care.   Medication Adjustments/Labs and Tests Ordered: Current medicines are reviewed at length with the patient today.  Concerns regarding medicines are outlined above. Medication changes, Labs and Tests ordered today are summarized above and listed in the Patient Instructions accessible in Encounters.   Signed, Eula Listen, PA-C 01/06/2024 1:41 PM     [facility] [place] [address] [place], [place] [address] [phone]

## 2024-01-06 ENCOUNTER — Ambulatory Visit: Payer: BC Managed Care – PPO | Attending: Physician Assistant | Admitting: Physician Assistant

## 2024-01-06 ENCOUNTER — Ambulatory Visit: Payer: BC Managed Care – PPO

## 2024-01-06 ENCOUNTER — Encounter: Payer: Self-pay | Admitting: Physician Assistant

## 2024-01-06 VITALS — BP 148/80 | HR 90 | Ht 69.0 in | Wt 154.4 lb

## 2024-01-06 DIAGNOSIS — I77810 Thoracic aortic ectasia: Secondary | ICD-10-CM

## 2024-01-06 DIAGNOSIS — J4540 Moderate persistent asthma, uncomplicated: Secondary | ICD-10-CM

## 2024-01-06 DIAGNOSIS — R002 Palpitations: Secondary | ICD-10-CM | POA: Diagnosis not present

## 2024-01-06 DIAGNOSIS — I25118 Atherosclerotic heart disease of native coronary artery with other forms of angina pectoris: Secondary | ICD-10-CM

## 2024-01-06 DIAGNOSIS — Z79899 Other long term (current) drug therapy: Secondary | ICD-10-CM | POA: Diagnosis not present

## 2024-01-06 DIAGNOSIS — R072 Precordial pain: Secondary | ICD-10-CM | POA: Diagnosis not present

## 2024-01-06 DIAGNOSIS — I493 Ventricular premature depolarization: Secondary | ICD-10-CM | POA: Diagnosis not present

## 2024-01-06 DIAGNOSIS — I10 Essential (primary) hypertension: Secondary | ICD-10-CM

## 2024-01-06 DIAGNOSIS — R0609 Other forms of dyspnea: Secondary | ICD-10-CM | POA: Diagnosis not present

## 2024-01-06 DIAGNOSIS — I5189 Other ill-defined heart diseases: Secondary | ICD-10-CM

## 2024-01-06 MED ORDER — METOPROLOL TARTRATE 100 MG PO TABS: 100.0000 mg | ORAL_TABLET | Freq: Once | ORAL | 0 refills | Status: DC

## 2024-01-06 NOTE — Patient Instructions (Signed)
 Medication Instructions:  Your Physician recommend you continue on your current medication as directed.    *If you need a refill on your cardiac medications before your next appointment, please call your pharmacy*   Lab Work: Your provider would like for you to have following labs drawn today CMeT, CBC, Lipid panel, and direct LDL.   If you have labs (blood work) drawn today and your tests are completely normal, you will receive your results only by: MyChart Message (if you have MyChart) OR A paper copy in the mail If you have any lab test that is abnormal or we need to change your treatment, we will call you to review the results.   Testing/Procedures: Your physician has requested that you have an echocardiogram. Echocardiography is a painless test that uses sound waves to create images of your heart. It provides your doctor with information about the size and shape of your heart and how well your heart's chambers and valves are working.   You may receive an ultrasound enhancing agent through an IV if needed to better visualize your heart during the echo. This procedure takes approximately one hour.  There are no restrictions for this procedure.  This will take place at [address] [facility] [address], [place] [address]  Please note: We ask at that you not bring children with you during ultrasound (echo/ vascular) testing. Due to room size and safety concerns, children are not allowed in the ultrasound rooms during exams. Our front office staff cannot provide observation of children in our lobby area while testing is being conducted. An adult accompanying a patient to their appointment will only be allowed in the ultrasound room at the discretion of the ultrasound technician under special circumstances. We apologize for any inconvenience.  ZIO XT- Long Term Monitor Instructions  Your physician has requested you wear a ZIO patch monitor for 14 days.  This is a single patch  monitor. [employer] supplies one patch monitor per enrollment. Additional stickers are not available. Please do not apply patch if you will be having a Nuclear Stress Test, Echocardiogram, Cardiac CT, MRI, or Chest Xray during the period you would be wearing the monitor. The patch cannot be worn during these tests. You cannot remove and re-apply the  ZIO XT patch monitor.  Your ZIO patch monitor will be mailed 3 day USPS to your address on file. It may take 3-5 days to receive your monitor after you have been enrolled.  Once you have received your monitor, please review the enclosed instructions. Your monitor has already been registered assigning a specific monitor serial # to you.  Billing and Patient Assistance Program Information  We have supplied [employer] with any of your insurance information on file for billing purposes. [employer] offers a sliding scale Patient Assistance Program for patients that do not have insurance, or whose insurance does not completely cover the cost of the ZIO monitor.  You must apply for the Patient Assistance Program to qualify for this discounted rate.  To apply, please call [employer] at [phone], select option 4, select option 2, ask to apply for Patient Assistance Program. Meredeth Ide will ask your household income, and how many people are in your household. They will quote your out-of-pocket cost based on that information.  [employer] will also be able to set up a 3-month, interest-free payment plan if needed.  Applying the monitor   Shave hair from upper left chest.  Hold abrader disc by orange tab. Rub abrader in 40  strokes over the upper left chest as indicated in your monitor instructions.  Clean area with 4 enclosed alcohol pads. Let dry.  Apply patch as indicated in monitor instructions. Patch will be placed under collarbone on left side of chest with arrow pointing upward.  Rub patch adhesive wings for 2 minutes. Remove white label marked "1". Remove the white  label marked "2". Rub patch adhesive wings for 2 additional minutes.  While looking in a mirror, press and release button in center of patch. A small green light will flash 3-4 times. This will be your only indicator that the monitor has been turned on.  Do not shower for the first 24 hours. You may shower after the first 24 hours.  Press the button if you feel a symptom. You will hear a small click. Record Date, Time and Symptom in the Patient Logbook.  When you are ready to remove the patch, follow instructions on the last 2 pages of Patient Logbook. Stick patch monitor onto the last page of Patient Logbook.  Place Patient Logbook in the blue and white box. Use locking tab on box and tape box closed securely. The blue and white box has prepaid postage on it. Please place it in the mailbox as soon as possible. Your physician should have your test results approximately 7 days after the  monitor has been mailed back to [facility].  Call [facility] Customer Care at [phone] if you have questions regarding your ZIO XT patch monitor. Call them immediately if you see an orange light blinking on your monitor.  If your monitor falls off in less than 4 days, contact our Monitor department at [phone].  If your monitor becomes loose or falls off after 4 days call [employer] at [phone] for suggestions on securing your monitor    Your cardiac CT will be scheduled at the below locations:    [facility] [address] [place], [place] [address] [phone]  If scheduled at [facility] or [facility], please arrive 15 mins early for check-in and test prep.  Please follow these instructions carefully (unless otherwise directed):  An IV will be required for this test and Nitroglycerin will be given.   On the Night Before the Test: Be sure to Drink plenty of water. Do not consume any  caffeinated/decaffeinated beverages or chocolate 12 hours prior to your test. Do not take any antihistamines 12 hours prior to your test.  On the Day of the Test: Drink plenty of water until 1 hour prior to the test. Do not eat any food 1 hour prior to test. You may take your regular medications prior to the test.  Take metoprolol (Lopressor) two hours prior to test. If you take Furosemide/Hydrochlorothiazide/Spironolactone/Chlorthalidone, please HOLD on the morning of the test. Patients who wear a continuous glucose monitor MUST remove the device prior to scanning. FEMALES- please wear underwire-free bra if available, avoid dresses & tight clothing   After the Test: Drink plenty of water. After receiving IV contrast, you may experience a mild flushed feeling. This is normal. On occasion, you may experience a mild rash up to 24 hours after the test. This is not dangerous. If this occurs, you can take Benadryl 25 mg, Zyrtec, Claritin, or Allegra and increase your fluid intake. (Patients taking Tikosyn should avoid Benadryl, and may take Zyrtec, Claritin, or Allegra) If you experience trouble breathing, this can be serious. If it is severe call 911 IMMEDIATELY.  If it is mild, please call our office.  We will call to schedule your test 2-4 weeks out understanding that some insurance companies will need an authorization prior to the service being performed.   For more information and frequently asked questions, please visit our website : [email or web address]  For non-scheduling related questions, please contact the cardiac imaging nurse navigator should you have any questions/concerns: Cardiac Imaging Nurse Navigators Direct Office Dial: [phone]   For scheduling needs, including cancellations and rescheduling, please call [place], [phone].    Follow-Up: At [facility], you and your health needs are our priority.  As part of our continuing mission to  provide you with exceptional heart care, we have created designated Provider Care Teams.  These Care Teams include your primary Cardiologist (physician) and Advanced Practice Providers (APPs -  Physician Assistants and Nurse Practitioners) who all work together to provide you with the care you need, when you need it.   Your next appointment:   3 month(s)  Provider:   You may see Julien Nordmann, MD

## 2024-01-07 LAB — COMPREHENSIVE METABOLIC PANEL
ALT: 10 IU/L (ref 0–32)
AST: 17 IU/L (ref 0–40)
Albumin: 4.4 g/dL (ref 3.9–4.9)
Alkaline Phosphatase: 91 IU/L (ref 44–121)
BUN/Creatinine Ratio: 21 (ref 12–28)
BUN: 15 mg/dL (ref 8–27)
Bilirubin Total: 0.2 mg/dL (ref 0.0–1.2)
CO2: 23 mmol/L (ref 20–29)
Calcium: 9.6 mg/dL (ref 8.7–10.3)
Chloride: 103 mmol/L (ref 96–106)
Creatinine, Ser: 0.73 mg/dL (ref 0.57–1.00)
Globulin, Total: 3.2 g/dL (ref 1.5–4.5)
Glucose: 89 mg/dL (ref 70–99)
Potassium: 4.4 mmol/L (ref 3.5–5.2)
Sodium: 142 mmol/L (ref 134–144)
Total Protein: 7.6 g/dL (ref 6.0–8.5)
eGFR: 92 mL/min/{1.73_m2} (ref >59)

## 2024-01-07 LAB — CBC
Hematocrit: 37.7 % (ref 34.0–46.6)
Hemoglobin: 12.2 g/dL (ref 11.1–15.9)
MCH: 26.1 pg — ABNORMAL LOW (ref 26.6–33.0)
MCHC: 32.4 g/dL (ref 31.5–35.7)
MCV: 81 fL (ref 79–97)
Platelets: 367 10*3/uL (ref 150–450)
RBC: 4.67 x10E6/uL (ref 3.77–5.28)
RDW: 13.3 % (ref 11.7–15.4)
WBC: 7.6 10*3/uL (ref 3.4–10.8)

## 2024-01-07 LAB — LIPID PANEL
Chol/HDL Ratio: 2.5 ratio (ref 0.0–4.4)
Cholesterol, Total: 199 mg/dL (ref 100–199)
HDL: 80 mg/dL (ref >39)
LDL Chol Calc (NIH): 106 mg/dL — ABNORMAL HIGH (ref 0–99)
Triglycerides: 74 mg/dL (ref 0–149)
VLDL Cholesterol Cal: 13 mg/dL (ref 5–40)

## 2024-01-07 LAB — LDL CHOLESTEROL, DIRECT: LDL Direct: 105 mg/dL — ABNORMAL HIGH (ref 0–99)

## 2024-02-03 ENCOUNTER — Other Ambulatory Visit: Payer: BC Managed Care – PPO

## 2024-02-03 ENCOUNTER — Ambulatory Visit: Payer: BC Managed Care – PPO | Attending: Physician Assistant

## 2024-02-03 DIAGNOSIS — R072 Precordial pain: Secondary | ICD-10-CM | POA: Diagnosis not present

## 2024-02-03 DIAGNOSIS — I77810 Thoracic aortic ectasia: Secondary | ICD-10-CM

## 2024-02-03 DIAGNOSIS — R0609 Other forms of dyspnea: Secondary | ICD-10-CM

## 2024-02-03 DIAGNOSIS — I25118 Atherosclerotic heart disease of native coronary artery with other forms of angina pectoris: Secondary | ICD-10-CM

## 2024-02-03 LAB — ECHOCARDIOGRAM COMPLETE
AR max vel: 3.46 cm2
AV Area VTI: 3.85 cm2
AV Area mean vel: 3.47 cm2
AV Mean grad: 3 mmHg
AV Peak grad: 6 mmHg
Ao pk vel: 1.22 m/s
Area-P 1/2: 3.31 cm2
Calc EF: 44.3 %
S' Lateral: 4.05 cm
Single Plane A2C EF: 44.8 %
Single Plane A4C EF: 44.2 %

## 2024-02-06 ENCOUNTER — Ambulatory Visit: Payer: BC Managed Care – PPO | Admitting: Medical

## 2024-02-06 DIAGNOSIS — R002 Palpitations: Secondary | ICD-10-CM | POA: Diagnosis not present

## 2024-02-06 DIAGNOSIS — I493 Ventricular premature depolarization: Secondary | ICD-10-CM | POA: Diagnosis not present

## 2024-02-10 ENCOUNTER — Other Ambulatory Visit: Payer: Self-pay | Admitting: Emergency Medicine

## 2024-02-10 DIAGNOSIS — Z79899 Other long term (current) drug therapy: Secondary | ICD-10-CM

## 2024-02-11 ENCOUNTER — Encounter (HOSPITAL_COMMUNITY): Payer: Self-pay

## 2024-02-11 ENCOUNTER — Other Ambulatory Visit: Payer: Self-pay | Admitting: Emergency Medicine

## 2024-02-11 DIAGNOSIS — Z79899 Other long term (current) drug therapy: Secondary | ICD-10-CM

## 2024-02-12 ENCOUNTER — Telehealth (HOSPITAL_COMMUNITY): Payer: Self-pay | Admitting: *Deleted

## 2024-02-12 NOTE — Telephone Encounter (Signed)
 Reaching out to patient to offer assistance regarding upcoming cardiac imaging study; pt verbalizes understanding of appt date/time, parking situation and where to check in, pre-test NPO status and medications ordered, and verified current allergies; name and call back number provided for further questions should they arise Johney Frame RN Navigator Cardiac Imaging [facility] [phone] office [phone] cell

## 2024-02-13 ENCOUNTER — Ambulatory Visit
Admission: RE | Admit: 2024-02-13 | Discharge: 2024-02-13 | Disposition: A | Discharge status: Home / Self Care | Payer: BC Managed Care – PPO | Source: Ambulatory Visit | Attending: Physician Assistant | Admitting: Physician Assistant

## 2024-02-13 DIAGNOSIS — R072 Precordial pain: Secondary | ICD-10-CM | POA: Insufficient documentation

## 2024-02-13 DIAGNOSIS — I25118 Atherosclerotic heart disease of native coronary artery with other forms of angina pectoris: Secondary | ICD-10-CM | POA: Diagnosis not present

## 2024-02-13 MED ORDER — METOPROLOL TARTRATE 5 MG/5ML IV SOLN
10.0000 mg | Freq: Once | INTRAVENOUS | Status: AC | PRN
  Administered 2024-02-13: 10 mg via INTRAVENOUS

## 2024-02-13 MED ORDER — DILTIAZEM HCL 25 MG/5ML IV SOLN
10.0000 mg | INTRAVENOUS | Status: AC | PRN
  Administered 2024-02-13: 5 mg via INTRAVENOUS
  Administered 2024-02-13: 10 mg via INTRAVENOUS

## 2024-02-13 MED ORDER — IOHEXOL 350 MG/ML SOLN
75.0000 mL | Freq: Once | INTRAVENOUS | Status: AC | PRN
  Administered 2024-02-13: 75 mL via INTRAVENOUS

## 2024-02-13 MED ORDER — NITROGLYCERIN 0.4 MG SL SUBL
0.8000 mg | SUBLINGUAL_TABLET | Freq: Once | SUBLINGUAL | Status: AC
  Administered 2024-02-13: 0.8 mg via SUBLINGUAL

## 2024-02-13 MED ORDER — SODIUM CHLORIDE 0.9 % IV SOLN: INTRAVENOUS | Status: DC

## 2024-02-13 NOTE — Progress Notes (Signed)
 Patient tolerated procedure well. Ambulate w/o difficulty. Denies light headedness or being dizzy. Sitting up drinking water provided. Encouraged to drink extra water today and reasoning explained. Verbalized understanding. All questions answered. ABC intact. No further needs. Discharge from procedure area w/o issues.

## 2024-03-24 ENCOUNTER — Ambulatory Visit: Payer: BC Managed Care – PPO | Admitting: Cardiovascular Disease

## 2024-04-01 NOTE — Progress Notes (Signed)
 Cardiology Office Note  Date:  04/02/2024   ID:  Khamiyah, Lisa Robbins 30, 1962, MRN [id number]  PCP:  Nikki Barters, MD   Chief Complaint  Patient presents with   Follow-up    Discuss CTA results. Patient c/o shortness of breath, lightheadedness/blurred vision at times and racing heart beats.     HPI:  [name] is a 63 year old woman with past medical history of Hypertension Dilated ascending aorta 4 cm Asthma, on inhaler, followed by pulmonary Chronic sob on exertion,  Who presents for f/u of her shortness of breath, palpitations  Last seen by myself in clinic July 2023 Seen by one of our providers February 2025  Echo performed through outside office in 10/2021 showed an EF greater than 55%, normal wall motion, diastolic dysfunction, normal RV cavity size, mild mitral regurgitation   Seen in our office July 2023 with shortness of breath, worse in the heat  Cardiac CTA February 2025 Coronary calcium score of 40 Normal coronary origin with right dominance. Minimal proximal LAD stenosis (<25%).  Seen in the office January 06, 2024, continued chest tightness exertional shortness of breath Fatigue, palpitations, pedal edema  Echo with ejection fraction 50 to 55%, normal RV function, no elevated pressures, no valvular heart disease  Slight chest discomfort at times, better with laying down  Followed by pulmonary Has tried different types of inhalers and pulmonary medications and has some intolerances  EKG personally reviewed by myself on todays visit EKG Interpretation Date/Time:  Thursday Apr 02 2024 11:59:40 EDT Ventricular Rate:  94 PR Interval:  142 QRS Duration:  102 QT Interval:  438 QTC Calculation: 547 R Axis:   33  Text Interpretation: Sinus rhythm with occasional Premature ventricular complexes Nonspecific T wave abnormality Confirmed by Belva Boyden [phone]) on 04/02/2024 12:16:48 PM   PMH:   has a past medical history of Aortic  atherosclerosis (HCC), Ascending aorta dilatation (HCC), Asthma, CAD (coronary artery disease), Dilated aortic root (HCC), Esophageal stricture, GERD (gastroesophageal reflux disease), Hyperlipemia, Muscle weakness, Musculoskeletal pain of lower extremity, Sleep disorder, and Thyroid  disease.  PSH:    Past Surgical History:  Procedure Laterality Date   DIAGNOSTIC LAPAROSCOPY  01/18/1997   LLQ PAIN, DYSMENORRHEA. AUB   DILATION AND CURETTAGE OF UTERUS  01/18/1997   ABD PAIN   TONSILLECTOMY     UPPER GI ENDOSCOPY  05/11/91; 10/20/91; 10/28/91   WITH BALOON DILATION   URETHRAL DILATION  2007   Current Outpatient Medications on File Prior to Visit  Medication Sig Dispense Refill   amitriptyline  (ELAVIL ) 25 MG tablet Take 5 tablets (125 mg total) by mouth at bedtime. Up to 4 tabs q hs prn 360 tablet 1   esomeprazole  (NEXIUM ) 40 MG capsule Take 1 capsule (40 mg total) by mouth daily at 12 noon. 30 capsule 4   furosemide  (LASIX ) 20 MG tablet TAKE 1 TABLET (20 MG TOTAL) BY MOUTH DAILY AS NEEDED (SHORTNESS OF BREATH). 90 tablet 1   Glucosamine HCl-MSM (MSM GLUCOSAMINE) 375-375 MG CAPS Take by mouth.     LORazepam  (ATIVAN ) 0.5 MG tablet Take 1 tablet (0.5 mg total) by mouth daily as needed for anxiety. 30 tablet 1   Magnesium  250 MG CAPS Take by mouth daily.     Multiple Vitamin (MULTIVITAMIN) capsule Take 1 capsule by mouth daily.     Potassium 99 MG TABS Take 99 mg by mouth daily.     Probiotic Product (PRO-BIOTIC BLEND PO) Take by mouth.     SEREVENT  DISKUS  50 MCG/ACT diskus inhaler INHALE 1 PUFF INTO THE LUNGS TWICE A DAY 60 each 3   thyroid  (ARMOUR) 60 MG tablet Take by mouth.     Turmeric (QC TUMERIC COMPLEX PO) Take by mouth. For inflammation     Vitamin D3 (VITAMIN D) 25 MCG tablet Take 1,000 Units by mouth daily.     albuterol  (VENTOLIN  HFA) 108 (90 Base) MCG/ACT inhaler TAKE 2 PUFFS BY MOUTH EVERY 6 HOURS AS NEEDED FOR WHEEZE OR SHORTNESS OF BREATH (Patient not taking: Reported on  04/02/2024) 8.5 each 3   No current facility-administered medications on file prior to visit.    Allergies:   Dust mite extract   Social History:  The patient  reports that she has never smoked. She has never used smokeless tobacco. She reports that she does not drink alcohol and does not use drugs.   Family History:   family history includes Cancer in her maternal grandfather; Cancer (age of onset: 74) in her maternal grandmother; Chronic fatigue in her mother; Diabetes in her father, paternal grandfather, and paternal grandmother; Hypertension in her mother; Stroke in her mother.   Review of Systems: Review of Systems  Constitutional: Negative.   HENT: Negative.    Respiratory:  Positive for shortness of breath.   Cardiovascular:  Positive for palpitations.  Gastrointestinal: Negative.   Musculoskeletal: Negative.   Neurological: Negative.   Psychiatric/Behavioral: Negative.    All other systems reviewed and are negative.   PHYSICAL EXAM: VS:  BP 130/78 (BP Location: Left Arm, Patient Position: Sitting, Cuff Size: Normal)   Pulse 94   Ht 5\' 9"  (1.753 m)   Wt 153 lb 8 oz (69.6 kg)   SpO2 98%   BMI 22.67 kg/m  , BMI Body mass index is 22.67 kg/m. Constitutional:  oriented to person, place, and time. No distress.  HENT:  Head: Grossly normal Eyes:  no discharge. No scleral icterus.  Neck: No JVD, no carotid bruits  Cardiovascular: Regular rate and rhythm, no murmurs appreciated Pulmonary/Chest: Clear to auscultation bilaterally, no wheezes or rails Abdominal: Soft.  no distension.  no tenderness.  Musculoskeletal: Normal range of motion Neurological:  normal muscle tone. Coordination normal. No atrophy Skin: Skin warm and dry Psychiatric: normal affect, pleasant  Recent Labs: 01/06/2024: ALT 10; BUN 15; Creatinine, Ser 0.73; Hemoglobin 12.2; Platelets 367; Potassium 4.4; Sodium 142    Lipid Panel Lab Results  Component Value Date   CHOL 199 01/06/2024   HDL 80  01/06/2024   LDLCALC 106 (H) 01/06/2024   TRIG 74 01/06/2024     Wt Readings from Last 3 Encounters:  04/02/24 153 lb 8 oz (69.6 kg)  01/06/24 154 lb 6 oz (70 kg)  12/31/23 157 lb (71.2 kg)     ASSESSMENT AND PLAN:  Problem List Items Addressed This Visit       Cardiology Problems   Primary hypertension   Relevant Orders   EKG 12-Lead (Completed)     Other   Airway hyperreactivity   Other Visit Diagnoses       Coronary artery disease involving native coronary artery of native heart with other form of angina pectoris (HCC)    -  Primary   Relevant Orders   EKG 12-Lead (Completed)     Chronic dyspnea         PVC's (premature ventricular contractions)       Relevant Orders   EKG 12-Lead (Completed)     Dilated aortic root (HCC)  Relevant Orders   EKG 12-Lead (Completed)     Ascending aorta dilatation (HCC)       Relevant Orders   EKG 12-Lead (Completed)       Chronic shortness of breath Worse in hot weather,  Sedentary at baseline Followed by pulmonary, does not appear to have complicated asthma -Recommend lung Works program for significant debility,  Cardiac workup negative to date Echocardiogram essentially normal, cardiac CTA with no significant coronary disease  Palpitations Likely secondary to ectopy, APCs, PVCs -Recommend she try metoprolol  tartrate 12.5 mg twice daily as needed for symptomatic palpitations - No further cardiac workup needed  -Chest pain Nonischemic in nature, low risk cardiac CTA Echocardiogram essentially normal Differential includes chest wall pain/rib discomfort Unable to exclude spasm/microvascular disease Suggest he could try isosorbide dinitrate 10 mg 3 times daily as needed for microvascular disease As she describes squeezing pain in the front radiating around the side to her back, concern for chest wall discomfort, consider chiropractic    Signed, Juanda Noon, M.D., Ph.D. [facility] [place],  [place] [phone]

## 2024-04-02 ENCOUNTER — Encounter: Payer: Self-pay | Admitting: Cardiovascular Disease

## 2024-04-02 ENCOUNTER — Ambulatory Visit: Attending: Cardiovascular Disease | Admitting: Cardiovascular Disease

## 2024-04-02 VITALS — BP 130/78 | HR 94 | Ht 69.0 in | Wt 153.5 lb

## 2024-04-02 DIAGNOSIS — I10 Essential (primary) hypertension: Secondary | ICD-10-CM | POA: Diagnosis not present

## 2024-04-02 DIAGNOSIS — I77810 Thoracic aortic ectasia: Secondary | ICD-10-CM

## 2024-04-02 DIAGNOSIS — J4540 Moderate persistent asthma, uncomplicated: Secondary | ICD-10-CM

## 2024-04-02 DIAGNOSIS — I25118 Atherosclerotic heart disease of native coronary artery with other forms of angina pectoris: Secondary | ICD-10-CM | POA: Diagnosis not present

## 2024-04-02 DIAGNOSIS — I493 Ventricular premature depolarization: Secondary | ICD-10-CM

## 2024-04-02 DIAGNOSIS — R0609 Other forms of dyspnea: Secondary | ICD-10-CM

## 2024-04-02 MED ORDER — METOPROLOL TARTRATE 25 MG PO TABS: 25.0000 mg | ORAL_TABLET | Freq: Two times a day (BID) | ORAL | 3 refills | Status: AC | PRN

## 2024-04-02 MED ORDER — ISOSORBIDE DINITRATE 20 MG PO TABS: 10.0000 mg | ORAL_TABLET | Freq: Every day | ORAL | 2 refills | Status: AC | PRN

## 2024-04-02 NOTE — Patient Instructions (Signed)
 Medication Instructions:  Please take isosorbide dinitrate 10 mg as needed for chest pain (20 mg cut in 1/2)  Take metoprolol  tartrate 12.5 mg up to twice a day as needed for palpitations  If you need a refill on your cardiac medications before your next appointment, please call your pharmacy.   Lab work: No new labs needed  Testing/Procedures: No new testing needed  Follow-Up: At [facility], you and your health needs are our priority.  As part of our continuing mission to provide you with exceptional heart care, we have created designated Provider Care Teams.  These Care Teams include your primary Cardiologist (physician) and Advanced Practice Providers (APPs -  Physician Assistants and Nurse Practitioners) who all work together to provide you with the care you need, when you need it.  You will need a follow up appointment as needed, APP ok  Providers on your designated Care Team:   Laneta Pintos, NP Varney Gentleman, PA-C Cadence Gennaro Khat, [place]  COVID-19 Vaccine Information can be found at: [email or web address] For questions related to vaccine distribution or appointments, please email [email or web address] or call [phone].

## 2024-04-07 ENCOUNTER — Ambulatory Visit: Payer: BC Managed Care – PPO | Admitting: Physician Assistant

## 2024-04-25 ENCOUNTER — Other Ambulatory Visit: Payer: Self-pay | Admitting: Internal Medicine

## 2024-04-25 DIAGNOSIS — K219 Gastro-esophageal reflux disease without esophagitis: Secondary | ICD-10-CM

## 2024-05-27 ENCOUNTER — Other Ambulatory Visit: Payer: Self-pay

## 2024-05-27 DIAGNOSIS — K219 Gastro-esophageal reflux disease without esophagitis: Secondary | ICD-10-CM

## 2024-05-27 MED ORDER — SEREVENT DISKUS 50 MCG/ACT IN AEPB: 1.0000 | INHALATION_SPRAY | Freq: Two times a day (BID) | RESPIRATORY_TRACT | 3 refills | Status: AC

## 2024-12-28 ENCOUNTER — Ambulatory Visit: Admitting: Internal Medicine
